# Patient Record
Sex: Female | Born: 1975 | Race: White | Hispanic: No | Smoking: Never smoker
Health system: Southern US, Community
[De-identification: ages and names within clinical notes are randomized; demographics above are authoritative.]

## PROBLEM LIST (undated history)

## (undated) DIAGNOSIS — E079 Disorder of thyroid, unspecified: Secondary | ICD-10-CM

## (undated) DIAGNOSIS — T7840XA Allergy, unspecified, initial encounter: Secondary | ICD-10-CM

## (undated) DIAGNOSIS — C801 Malignant (primary) neoplasm, unspecified: Secondary | ICD-10-CM

## (undated) DIAGNOSIS — F419 Anxiety disorder, unspecified: Secondary | ICD-10-CM

## (undated) DIAGNOSIS — D239 Other benign neoplasm of skin, unspecified: Secondary | ICD-10-CM

## (undated) DIAGNOSIS — J189 Pneumonia, unspecified organism: Secondary | ICD-10-CM

## (undated) HISTORY — PX: OTHER SURGICAL HISTORY: SHX169

## (undated) HISTORY — DX: Allergy, unspecified, initial encounter: T78.40XA

## (undated) HISTORY — PX: KNEE SURGERY: SHX244

## (undated) HISTORY — DX: Other benign neoplasm of skin, unspecified: D23.9

---

## 2005-08-20 ENCOUNTER — Ambulatory Visit: Payer: Self-pay | Admitting: Internal Medicine

## 2005-09-17 ENCOUNTER — Other Ambulatory Visit: Admission: RE | Admit: 2005-09-17 | Discharge: 2005-09-17 | Payer: Self-pay | Admitting: Neurology

## 2005-10-09 ENCOUNTER — Ambulatory Visit: Payer: Self-pay | Admitting: Internal Medicine

## 2006-01-24 ENCOUNTER — Ambulatory Visit: Payer: Self-pay | Admitting: Internal Medicine

## 2006-10-09 ENCOUNTER — Ambulatory Visit: Payer: Self-pay | Admitting: Internal Medicine

## 2006-10-09 ENCOUNTER — Ambulatory Visit (HOSPITAL_COMMUNITY): Admission: RE | Admit: 2006-10-09 | Discharge: 2006-10-09 | Payer: Self-pay | Admitting: Internal Medicine

## 2006-10-22 ENCOUNTER — Ambulatory Visit: Payer: Self-pay | Admitting: Internal Medicine

## 2007-10-14 ENCOUNTER — Inpatient Hospital Stay (HOSPITAL_COMMUNITY): Admission: AD | Admit: 2007-10-14 | Discharge: 2007-10-14 | Payer: Self-pay | Admitting: Obstetrics and Gynecology

## 2007-11-04 ENCOUNTER — Encounter: Payer: Self-pay | Admitting: Internal Medicine

## 2007-11-04 DIAGNOSIS — J309 Allergic rhinitis, unspecified: Secondary | ICD-10-CM | POA: Insufficient documentation

## 2007-11-04 DIAGNOSIS — J45909 Unspecified asthma, uncomplicated: Secondary | ICD-10-CM | POA: Insufficient documentation

## 2008-01-10 ENCOUNTER — Inpatient Hospital Stay (HOSPITAL_COMMUNITY): Admission: AD | Admit: 2008-01-10 | Discharge: 2008-01-12 | Payer: Self-pay | Admitting: Obstetrics and Gynecology

## 2008-01-15 ENCOUNTER — Inpatient Hospital Stay (HOSPITAL_COMMUNITY): Admission: AD | Admit: 2008-01-15 | Discharge: 2008-01-16 | Payer: Self-pay | Admitting: Obstetrics and Gynecology

## 2008-04-15 ENCOUNTER — Other Ambulatory Visit: Admission: RE | Admit: 2008-04-15 | Discharge: 2008-04-15 | Payer: Self-pay | Admitting: Obstetrics and Gynecology

## 2008-08-12 ENCOUNTER — Telehealth: Payer: Self-pay | Admitting: Internal Medicine

## 2009-02-21 ENCOUNTER — Telehealth: Payer: Self-pay | Admitting: Internal Medicine

## 2009-10-13 ENCOUNTER — Ambulatory Visit (HOSPITAL_COMMUNITY): Admission: RE | Admit: 2009-10-13 | Discharge: 2009-10-13 | Payer: Self-pay | Admitting: Obstetrics and Gynecology

## 2009-11-29 ENCOUNTER — Telehealth: Payer: Self-pay | Admitting: Internal Medicine

## 2009-12-05 ENCOUNTER — Telehealth: Payer: Self-pay | Admitting: Internal Medicine

## 2009-12-30 ENCOUNTER — Inpatient Hospital Stay (HOSPITAL_COMMUNITY): Admission: AD | Admit: 2009-12-30 | Discharge: 2010-01-01 | Payer: Self-pay | Admitting: Obstetrics and Gynecology

## 2010-05-30 ENCOUNTER — Ambulatory Visit: Payer: Self-pay | Admitting: Obstetrics and Gynecology

## 2010-05-30 ENCOUNTER — Other Ambulatory Visit: Admission: RE | Admit: 2010-05-30 | Discharge: 2010-05-30 | Payer: Self-pay | Admitting: Obstetrics and Gynecology

## 2010-05-31 ENCOUNTER — Telehealth: Payer: Self-pay | Admitting: Internal Medicine

## 2010-08-17 ENCOUNTER — Ambulatory Visit: Payer: Self-pay | Admitting: Internal Medicine

## 2010-11-07 NOTE — Progress Notes (Signed)
  Phone Note Refill Request   Refills Requested: Medication #1:  PULMICORT FLEXHALER 180 MCG/ACT AEPB inhale 2 sprays two times a day Initial call taken by: Ami Bullins CMA,  May 31, 2010 2:04 PM    Prescriptions: PULMICORT FLEXHALER 180 MCG/ACT AEPB (BUDESONIDE) inhale 2 sprays two times a day  #1 x 12   Entered by:   Ami Bullins CMA   Authorized by:   Jacques Navy MD   Signed by:   Bill Salinas CMA on 05/31/2010   Method used:   Electronically to        [facility] (retail)       [address]       [place], [place]  [address]       Ph: [phone]       Fax: [phone]   RxID:   [id number]

## 2010-11-07 NOTE — Assessment & Plan Note (Signed)
Summary: flu shot-lb  Nurse Visit   Allergies: 1)  ! * Shellfish  Orders Added: 1)  Admin 1st Vaccine [[address]] 2)  Flu Vaccine 5yrs + [[address]]      Flu Vaccine Consent Questions     Do you have a history of severe allergic reactions to this vaccine? no    Any prior history of allergic reactions to egg and/or gelatin? no    Do you have a sensitivity to the preservative Thimersol? no    Do you have a past history of Guillan-Barre Syndrome? no    Do you currently have an acute febrile illness? no    Have you ever had a severe reaction to latex? no    Vaccine information given and explained to patient? yes    Are you currently pregnant? no    Lot Number:[id number]   Exp Date:04/07/2011   Site Given  Left Deltoid IM1

## 2010-11-07 NOTE — Progress Notes (Signed)
  Phone Note Refill Request Message from:  Fax from Pharmacy on December 05, 2009 9:51 AM  Refills Requested: Medication #1:  VENTOLIN HFA 108 (90 BASE) MCG/ACT AERS take 2 puffs q 4 hours prn. Initial call taken by: Ami Bullins CMA,  December 05, 2009 9:51 AM    Prescriptions: VENTOLIN HFA 108 (90 BASE) MCG/ACT AERS (ALBUTEROL SULFATE) take 2 puffs q 4 hours prn  #1 x 3   Entered by:   Ami Bullins CMA   Authorized by:   Jacques Navy MD   Signed by:   Bill Salinas CMA on 12/05/2009   Method used:   Faxed to ...       [facility] (retail)       [address]       [place], [place]  [address]       Ph: [phone]       Fax: [phone]   RxID:   [id number]

## 2010-11-07 NOTE — Progress Notes (Signed)
  Phone Note Refill Request Message from:  Fax from Pharmacy on November 29, 2009 12:47 PM  Refills Requested: Medication #1:  VENTOLIN HFA 108 (90 BASE) MCG/ACT AERS take 2 puffs q 4 hours prn.   Last Refilled: 08/22/2009 Last Office Visit was 2008, Please Advise refill  Initial call taken by: Ami Bullins CMA,  November 29, 2009 12:48 PM  Follow-up for Phone Call        ok for refill. When is the baby due? Follow-up by: Jacques Navy MD,  November 29, 2009 5:49 PM    Prescriptions: VENTOLIN HFA 108 (90 BASE) MCG/ACT AERS (ALBUTEROL SULFATE) take 2 puffs q 4 hours prn  #1 x 1   Entered by:   Ami Bullins CMA   Authorized by:   Jacques Navy MD   Signed by:   Bill Salinas CMA on 11/30/2009   Method used:   Electronically to        [facility]. [phone]* (retail)       [address]       [place], [place]  [address]       Ph: [phone]       Fax: [phone]   RxID:   [id number]

## 2010-12-24 LAB — RH IMMUNE GLOBULIN WORKUP (NOT WOMEN'S HOSP)
ABO/RH(D): O NEG
Antibody Screen: NEGATIVE

## 2010-12-31 LAB — CBC
HCT: 38.1 % (ref 36.0–46.0)
Hemoglobin: 13.2 g/dL (ref 12.0–15.0)
MCV: 93 fL (ref 78.0–100.0)
MCV: 94.2 fL (ref 78.0–100.0)
Platelets: 182 10*3/uL (ref 150–400)
Platelets: 199 10*3/uL (ref 150–400)
RBC: 3.69 MIL/uL — ABNORMAL LOW (ref 3.87–5.11)
RBC: 4.1 MIL/uL (ref 3.87–5.11)
RDW: 13.9 % (ref 11.5–15.5)
WBC: 17.9 10*3/uL — ABNORMAL HIGH (ref 4.0–10.5)

## 2010-12-31 LAB — RH IMMUNE GLOB WKUP(>/=20WKS)(NOT WOMEN'S HOSP)

## 2011-01-09 ENCOUNTER — Other Ambulatory Visit: Payer: Self-pay | Admitting: Internal Medicine

## 2011-02-20 NOTE — H&P (Signed)
NAME:  Amy Bautista, Amy Bautista          ACCOUNT NO.:  [id number]   MEDICAL RECORD NO.:  [id number]          PATIENT TYPE:  MAT   LOCATION:  [facility]                          FACILITY:  [facility]   PHYSICIAN:  Osborn Coho, M.D.   DATE OF BIRTH:  January 06, 1976   DATE OF ADMISSION:  01/10/2008  DATE OF DISCHARGE:                              HISTORY & PHYSICAL   The patient is a 35 year old, married white female, primigravida at 75  and 5/7 weeks per an [facility] of January 05, 2008 who presents to maternity  admissions in early labor with regular contractions since last night.  She was having irregular contractions every 5-10 minutes over the last  24 hours. She reports little rest.  She denies any leakage of fluid, PIH  or UTI signs or symptoms, shortness of breath, cough or fever.  She  reports good fetal movement.  She has had some nausea but no vomiting.  She was seen in the office on Thursday and cervix was 1 cm, membranes  were stripped by Wynelle Bourgeois, CNM. Previously this week on Monday,  March 30, the patient's grandmother had passed away and the patient's  blood pressure was slightly elevated at 122/90 and PIH labs were drawn.  They were within normal limits and since that visit she had been  normotensive.  She did have a reactive NST on Thursday in the office  when she followed up. She has been followed by the CNM service at  [facility] and history has been remarkable for: 1)  Rh  negative, 2) history of asthma.   OBSTETRICAL HISTORY:  The patient is a primigravida.   PRENATAL LABS:  The patient's blood type is O negative, HIV nonreactive,  GC and chlamydia were negative, Rh antibody screen was negative, RPR  nonreactive.  She is rubella immune. Hepatitis surface antigen was  negative and hemoglobin at her new OB was 11.9.  Group beta strep is  negative.   PAST MEDICAL HISTORY:  She reports menarche at age 77.  She reports 28-  31 days cycles with length of flow 4-5 days.  She  usually has heavy  menses with some cramping. She reports condoms for birth control in the  past. She has had an abnormal Pap but repeat was normal after a  colposcopy in March of 2008 that showed reactive changes.  She has had  occasional yeast, she reports normal childhood illnesses including  varicella.  Reports asthma since child. She has taken Pulmicort and  Zyrtec in the past and Dr. Debby Bud is who she follows. She had a bladder  infection in 2002. She had a dislocated right knee at age 46.  She has  had dental surgery at age 78.   GENETIC HISTORY:  Remarkable for patient's cousin being mentally  retarded and father of the baby's grandmother had twins.   FAMILY HISTORY:  Paternal grandfather an MI, brother with chronic  hypertension, father had melanoma, he is deceased.  Maternal grandmother  had breast cancer.  Mother depression. Mother nicotine addiction and  both parents alcoholics as well as other family members.   ALLERGIES:  Shellfish,  unknown iodine status. Cats, trees and then other  environmental. She denies medication or latex allergies.   SOCIAL HISTORY:  She is a married white female.  Husband's name is Denice Paradise. He is supportive and at bedside.  The patient reports 16 years  of education and is a Psychologist, clinical full time. Her  husband has had 19 years of education and is a full-time physical  therapist. They do not report a religious affiliation. The patient  entered care in September 2008 at [facility]. She reported at that time that  her husband that was finishing school in [place] for his physical  therapy. Unsure regarding doing quad screen. She was somewhere around 14  weeks, the first page of her prenatal record is missing at the time of  this dictation.  She was then seen at 18-1/7 weeks, had anatomy  ultrasound showing single intrauterine pregnancy size consistent with  dates.  Cervical length 4.35 cm, posterior placenta, normal fluid with   normal growth and development.  No anomalies were observed at that time.  She had had some paresthesia in her right upper thigh but was improving  at that time. Seen then at 22 weeks she had an audible decel. The  patient had an ultrasound for AFI that 17.9, placenta was anterior and  grade 1, cervix was 4.4 cm. Fetal kick counts were reviewed. The patient  was then seen at 23 weeks, was doing well,  returned at that time and  had seen Dr. Estanislado Pandy after the decel while she was in the office the  previous time. She then had her Glucola at 26-4/7 weeks.  Hemoglobin was  11.2. The Glucola was within normal limits equal to 106, her RPR was  nonreactive.  She was doing well. Pregnancy continued to progress  without other complications. Her mom had recently been here from  [place] and as was mentioned in the HPI she had a slightly high blood  pressure for several weeks after hearing of the death of her grandmother  but is since normotensive   OBJECTIVE:  VITAL SIGNS:  On admission the patient's blood pressure was  130s/80s, temperature was 99. EFM heart rate was in one teens on first  arrival and has since resolved to a baseline in the 120s, moderate  variability, reactive, no decels, toco UCs moderate on palpation every 2-  4 minutes.  GENERAL:  Noted discomfort with contractions.  She is alert and oriented  x3.  HEENT:  Grossly intact and within normal limits.  CARDIOVASCULAR:  Regular rate and rhythm without murmur.  LUNGS:  Clear to auscultation bilaterally.  ABDOMEN:  Soft, nontender and gravid.  Estimated fetal weight 7 to 7-1/2  pounds.  PELVIC:  Cervix was 3-1/2 cm, 80%,  -1 vertex, intact.  She had a small  amount of vaginal bleeding noted on glove but no fluid.  EXTREMITIES:  Within normal limits, just some trace edema.  DTRs were  2+, no clonus.   IMPRESSION:  1. Intrauterine pregnancy at 40 and 5.  2. Early labor.  3. Group beta strep negative.  4. Noted discomfort with  contractions but the patient is undecided      regarding pain interventions   PLAN:  1. Admit to birthing suites with Dr. Su Hilt as attending physician.  2. Routine CNM and L&D orders.  3. Support and/or epidural p.r.n.  4. Plan AROM p.r.n. augmentation.  5. Consult with MD p.r.n.      Candice Oakland,  CNM      Osborn Coho, M.D.  Electronically Signed    CHS/MEDQ  D:  01/10/2008  T:  01/10/2008  Job:  [id number]

## 2011-02-23 NOTE — Assessment & Plan Note (Signed)
[facility]                           PRIMARY CARE OFFICE NOTE   NAME:[name], [name]                  MRN:          [id number]  DATE:10/10/2006                            DOB:          Nov 26, 1975    HISTORY OF PRESENT ILLNESS:  [name] was seen on October 09, 2006.  Please see that dictation.  This is a home visit note.   The patient has been taking Omnicef for three days and started  azithromycin 500 mg daily today.  She reports that she is feeling  somewhat better.  She has been able to take a small amount of  nourishment.  She continues to force fluids.  She has had some mild  nausea but no emesis.  She reports her pleuritic chest pain has improved  and that she is not short of breath.  She does have a continuous cough  that is productive of scan amount of sputum.   PHYSICAL EXAMINATION:  The patient was examined in the living room  sitting on the couch.  VITAL SIGNS:  Blood pressure 103/75, heart rate 85, respirations 16 and  unlabored, temperature 97.1.  GENERAL:  Well-nourished woman in no acute distress.  She has no  increased work of breathing.  CHEST:  The patient has no wheezing.  Air is moving well.  She has  positive egophony at the right back chest.   IMPRESSION/PLAN:  Pneumonia.  The patient seems stable.  Blood pressures  improved.  She is afebrile.  Heart rate is down from 112.   PLAN:  The patient is to continue azithromycin for a full three day  course.  She is to continue [facility] for a full 10-day course.  She is to  continue with Phenergan with codeine p.r.n.  The patient is advised that  she has fever, inability to keep down clear fluids or medication,  increasing shortness of breath or other signs of deterioration.  She is  to notify me for immediate evaluation.  If the patient does well, I  would like to see her back in the office in 14 days for follow up and  follow up chest x-ray.     Rosalyn Gess  Norins, MD  Electronically Signed    MEN/MedQ  DD: 10/11/2006  DT: 10/11/2006  Job #: [id number]

## 2011-02-23 NOTE — Assessment & Plan Note (Signed)
[facility]                           PRIMARY CARE OFFICE NOTE   NAME:WESTELLYSA, PARRACK                  MRN:          [id number]  DATE:10/09/2006                            DOB:          08/02/1976    Ms. Corinda Gubler is a 35 year old woman who I saw yesterday,  October 09, 2006.  She reports that 4 weeks ago, she developed a cough that was  productive of a purulent sputum.  Her uncle diagnosed her with  bronchitis and started her on Omnicef 300 mg b.i.d. for 7 days.  She had  significant improvement in her symptoms.  The patient subsequently made  a trip to [place] and while there developed a cold.  She had no  treatment.  She returned on December 31 to the [place].  She reports that she  took a  long walk and returned very chilled.  She had no appetite, she  has had a weak, nonproductive cough.  She has had sweats and what sputum  she has been able to produce has been snotty in appearance.  She  generally feels weak.  She has not eaten much, but she has been able to  keep down fluids.   PAST MEDICAL HISTORY:  Significant for asthma, which she controls with  Advair 100/50 one inhalation b.i.d.  Other past medical history is well  documented.   REVIEW OF SYSTEMS:  Significant for fever, chills and myalgias.  No  ophthal, ENT, cardiovascular complaints.  Respiratory as the above.  No  GI complaints.  Specifically denies vomiting or diarrhea.  The patient  does complain of chest wall pain.   EXAMINATION:  Temperature was 97.4, blood pressure 96/59, pulse 112,  weight is 168.  GENERAL APPEARANCE:  This is a well-nourished woman who is in no acute  distress but does appear to be ill.  HEENT:  Normocephalic, atraumatic.  Oral pharynx without lesions.  Posterior pharynx was clear.  CHEST:  The patient had good breath sounds.  There were slightly  decreased breath sounds at the right base.  There was a question of very  faint end-expiratory wheeze at the  right base.  The patient had positive  egophony and positive fremitus at the base.  The patient had discomfort  with deep inspiration.  The patient's chest wall was tender to palpation  at the anterior chest wall and at the axillary line.   ASSESSMENT:  Pneumonia.  The patient with probable pneumonia based on  physical exam.  She was started on Omnicef 300 mg b.i.d. for 10 days,  Phenergan with codeine cough syrup 1 teaspoon q.6 two teaspoons at  bedtime, Mucinex 2 tablets a.m. and p.m.  She is instructed to hydrate  vigorously.  The patient was sent for chest  x-ray.   Chest x-ray report was acute right lower lobe pneumonia.  A CBC could  not be drawn because patient was too dehydrated for blood draw.  I spoke  with the patient in the evening.  She had been able to take down copious  fluids.  She had no nausea or vomiting.   FOLLOWUP October 10, 2006:  This morning the patient reports that she  slept well.  She has been continuing to hydrate.  She has had no nausea  or vomiting.  She reports she still has a cough.  She has decreased  pleuritic type pain.  She reports no wheezing but she does have a deep  kind of rattle with deep inspiration.   PLAN:  The patient to continue instructions as outlined above.  We will  add azithromycin 500 mg daily x3 to the patient's regimen.  We will  follow up with her this afternoon and we will make home visit in order  to monitor oxygen saturation, blood pressure and temperature as well as  repeat auscultation of the chest.     Rosalyn Gess. Norins, MD  Electronically Signed    MEN/MedQ  DD: 10/10/2006  DT: 10/10/2006  Job #: [id number]

## 2011-05-23 ENCOUNTER — Encounter: Payer: Self-pay | Admitting: Gynecology

## 2011-06-05 ENCOUNTER — Encounter: Payer: Self-pay | Admitting: Obstetrics and Gynecology

## 2011-06-28 LAB — RH IMMUNE GLOBULIN WORKUP (NOT WOMEN'S HOSP)
ABO/RH(D): O NEG
Antibody Screen: NEGATIVE

## 2011-07-03 LAB — RH IMMUNE GLOB WKUP(>/=20WKS)(NOT WOMEN'S HOSP): Fetal Screen: NEGATIVE

## 2011-07-03 LAB — COMPREHENSIVE METABOLIC PANEL
AST: 52 — ABNORMAL HIGH
Albumin: 3.1 — ABNORMAL LOW
CO2: 25
Calcium: 8.9
Chloride: 101
Creatinine, Ser: 0.89
Glucose, Bld: 93
Potassium: 3.9
Sodium: 136
Total Protein: 6.6

## 2011-07-03 LAB — CBC
HCT: 30.3 — ABNORMAL LOW
HCT: 37.7
Hemoglobin: 10.5 — ABNORMAL LOW
Hemoglobin: 13.1
MCV: 93.4
Platelets: 279
RBC: 3.21 — ABNORMAL LOW
RDW: 13.3
WBC: 13.5 — ABNORMAL HIGH

## 2011-07-03 LAB — URINE MICROSCOPIC-ADD ON

## 2011-07-03 LAB — URIC ACID: Uric Acid, Serum: 5.9

## 2011-07-03 LAB — URINALYSIS, ROUTINE W REFLEX MICROSCOPIC
Glucose, UA: NEGATIVE
Specific Gravity, Urine: <1.005 — ABNORMAL LOW
pH: 6.5

## 2011-07-13 ENCOUNTER — Other Ambulatory Visit (HOSPITAL_COMMUNITY)
Admission: RE | Admit: 2011-07-13 | Discharge: 2011-07-13 | Disposition: A | Discharge status: Home / Self Care | Payer: BC Managed Care – PPO | Source: Ambulatory Visit | Attending: Obstetrics and Gynecology | Admitting: Obstetrics and Gynecology

## 2011-07-13 ENCOUNTER — Encounter: Payer: Self-pay | Admitting: Obstetrics and Gynecology

## 2011-07-13 ENCOUNTER — Ambulatory Visit (INDEPENDENT_AMBULATORY_CARE_PROVIDER_SITE_OTHER): Payer: BC Managed Care – PPO | Admitting: Obstetrics and Gynecology

## 2011-07-13 VITALS — BP 110/78 | Ht 69.25 in | Wt 170.0 lb

## 2011-07-13 DIAGNOSIS — Z01419 Encounter for gynecological examination (general) (routine) without abnormal findings: Secondary | ICD-10-CM

## 2011-07-13 DIAGNOSIS — Z23 Encounter for immunization: Secondary | ICD-10-CM

## 2011-07-13 NOTE — Progress Notes (Signed)
The patient came back to see me today for an annual GYN exam. She is still nursing once a day at 19 months. In spite of that she's having regular periods. Her periods are much better than they were before children. She contraception with condoms. She is contemplating a Mirena IUD. She has done poorly with hormones in the past and that makes her a little nervous.  Physical examination: HEENT within normal limits. Neck: Thyroid not large. No masses. Supraclavicular nodes: not enlarged. Breasts: Examined in both sitting midline position. No skin changes and no masses. Abdomen: Soft no guarding rebound or masses or hernia. Pelvic: External: Within normal limits. BUS: Within normal limits. Vaginal:within normal limits. Good estrogen effect. No evidence of cystocele rectocele or enterocele. Cervix: clean. Uterus: Normal size and shape. Adnexa: No masses. Rectovaginal exam: Confirmatory and negative. Extremities: Within normal limits.   Assessment: Normal GYN exam  Plan: We had a long discussion about types of contraception. I tried to reassure her about a Mirena IUD without giving her a guarantee. She will decide and let me know. They may choose for her husband have a vasectomy.

## 2011-07-17 ENCOUNTER — Telehealth: Payer: Self-pay | Admitting: *Deleted

## 2011-07-17 NOTE — Telephone Encounter (Signed)
Patient called and asked about getting benefits processed for Mirena IUD. Told patient IUD covered with a $30 copay.  Patient will think about it and let us know if that is indeed something she wants to go through with.

## 2011-07-18 ENCOUNTER — Other Ambulatory Visit: Payer: Self-pay | Admitting: *Deleted

## 2011-07-18 MED ORDER — BUDESONIDE 180 MCG/ACT IN AEPB: 1.0000 | INHALATION_SPRAY | Freq: Two times a day (BID) | RESPIRATORY_TRACT | Status: DC

## 2011-07-23 ENCOUNTER — Ambulatory Visit (INDEPENDENT_AMBULATORY_CARE_PROVIDER_SITE_OTHER): Payer: BC Managed Care – PPO | Admitting: Internal Medicine

## 2011-07-23 VITALS — BP 114/64 | HR 78 | Temp 97.0°F | Wt 167.0 lb

## 2011-07-23 DIAGNOSIS — J069 Acute upper respiratory infection, unspecified: Secondary | ICD-10-CM

## 2011-07-23 MED ORDER — FLUTICASONE PROPIONATE 50 MCG/ACT NA SUSP: 1.0000 | Freq: Every day | NASAL | Status: DC

## 2011-07-23 MED ORDER — DOXYCYCLINE HYCLATE 100 MG PO TABS: 100.0000 mg | ORAL_TABLET | Freq: Two times a day (BID) | ORAL | Status: AC

## 2011-07-24 ENCOUNTER — Encounter: Payer: Self-pay | Admitting: Internal Medicine

## 2011-07-24 NOTE — Progress Notes (Signed)
  Subjective:    Patient ID: Amy Bautista, female    DOB: 1976-03-14, 35 y.o.   MRN: [id number]  HPI Amy Bautista presents with a 1 month h/o cough productive thick, colored sputum. She has not had fever or chills. Her children have been sick with mycoplasma. Her asthma has not flared. She has not had DOE. No N/V.  Past Medical History  Diagnosis Date  . Asthma   . Dysplastic nevi     excised lesions: Left UE, Right LE   Past Surgical History  Procedure Date  . Orthodontic   . G2p2    Family History  Problem Relation Age of Onset  . Cancer Father     melanoma  . Hypertension Brother   . Hypertension Paternal Grandfather   . Heart disease Paternal Grandfather   . Cancer Paternal Grandmother     LUNG   . Cancer Maternal Grandmother     breast   History   Social History  . Marital Status: Married    Spouse Name: N/A    Number of Children: 2  . Years of Education: 12+   Occupational History  . yog instructor    Social History Main Topics  . Smoking status: Never Smoker   . Smokeless tobacco: Never Used  . Alcohol Use: 2.0 oz/week    4 drink(s) per week  . Drug Use: No     former [facility]C  . Sexually Active: Yes -- Female partner(s)    Birth Control/ Protection: Condom   Other Topics Concern  . 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 No h/o abuse.       Review of Systems System review is negative for any constitutional, cardiac, pulmonary, GI or neuro symptoms or complaints other than as described in the HPI.     Objective:   Physical Exam Vitals reviewed - normal Gen'l - WNWD white woman in no distress HEENT - [place]/AT, no sinus tenderness Chest - no rales or wheee Cor - RRR       Assessment & Plan:  URI- exposed to mycoplasma, prolonged cough that is productive.   Plan - Doxycycline 100 mg bid x 7           Anti-tussive of choice: robitussin DM  or equivalent           Flonase prn

## 2011-08-07 ENCOUNTER — Telehealth: Payer: Self-pay | Admitting: *Deleted

## 2011-08-07 NOTE — Telephone Encounter (Signed)
Pt called with referral information to [facility] at [phone]. I left her a message.  Need to know if she needs a referral or what.

## 2011-08-14 NOTE — Telephone Encounter (Signed)
Pt never called back therefore closed encounter

## 2012-03-02 ENCOUNTER — Other Ambulatory Visit: Payer: Self-pay | Admitting: Internal Medicine

## 2012-06-14 ENCOUNTER — Other Ambulatory Visit: Payer: Self-pay | Admitting: Internal Medicine

## 2012-06-14 MED ORDER — ALPRAZOLAM 0.25 MG PO TABS
0.2500 mg | ORAL_TABLET | Freq: Every day | ORAL | Status: AC | PRN
Start: 2012-06-14 — End: 2012-07-14

## 2012-07-07 ENCOUNTER — Encounter: Payer: Self-pay | Admitting: Internal Medicine

## 2012-07-07 ENCOUNTER — Ambulatory Visit (INDEPENDENT_AMBULATORY_CARE_PROVIDER_SITE_OTHER): Payer: BC Managed Care – PPO | Admitting: Internal Medicine

## 2012-07-07 VITALS — BP 106/68 | HR 72 | Temp 98.1°F | Resp 16 | Wt 167.0 lb

## 2012-07-07 DIAGNOSIS — N943 Premenstrual tension syndrome: Secondary | ICD-10-CM | POA: Insufficient documentation

## 2012-07-07 MED ORDER — AMOXICILLIN 875 MG PO TABS: 875.0000 mg | ORAL_TABLET | Freq: Two times a day (BID) | ORAL | Status: AC

## 2012-07-07 NOTE — Assessment & Plan Note (Signed)
Has variable symptoms - often with irritability.   Plan - alprazolam 0.25 mg prn. Discussed safety and side affects with her.

## 2012-07-07 NOTE — Patient Instructions (Addendum)
Otitis media left ear - plan: amoxicillin 875 mg twice a day for 7 days; sudafed 30 mg two or three times day for eustachian tube dysfunction; tylenol 500 mg for fever.  For perimenstrual mood disturbance it is very reasonable to use the Xanax as needed.  Otitis Media, Adult A middle ear infection is an infection in the space behind the eardrum. The medical name for this is "otitis media." It may happen after a common cold. It is caused by a germ that starts growing in that space. You may feel swollen glands in your neck on the side of the ear infection. HOME CARE INSTRUCTIONS    Take your medicine as directed until it is gone, even if you feel better after the first few days.   Only take over-the-counter or prescription medicines for pain, discomfort, or fever as directed by your caregiver.   Occasional use of a nasal decongestant a couple times per day may help with discomfort and help the eustachian tube to drain better.  Follow up with your caregiver in 10 to 14 days or as directed, to be certain that the infection has cleared. Not keeping the appointment could result in a chronic or permanent injury, pain, hearing loss and disability. If there is any problem keeping the appointment, you must call back to this facility for assistance. SEEK IMMEDIATE MEDICAL CARE IF:    You are not getting better in 2 to 3 days.   You have pain that is not controlled with medication.   You feel worse instead of better.   You cannot use the medication as directed.   You develop swelling, redness or pain around the ear or stiffness in your neck.  MAKE SURE YOU:    Understand these instructions.   Will watch your condition.   Will get help right away if you are not doing well or get worse.  Document Released: 06/29/2004 Document Revised: 09/13/2011 Document Reviewed: 04/30/2008 [facility] Patient Information 2012 [place], [place].

## 2012-07-07 NOTE — Progress Notes (Signed)
Subjective:    Patient ID: Amy Bautista, female    DOB: [birth year], 36 y.o.   MRN: [id number]  HPI Amy Bautista reports that she has had a mild URI. She was awakened in the middle of the night by severe left ear pain and dampened hearing. She took otc ibuprofen and APAP. Her pain is much better. She has continued to have muffled hearing. No fever, no sore throat, no tender lymph nodes.  Past Medical History  Diagnosis Date  . Asthma   . Dysplastic nevi     excised lesions: Left UE, Right LE   Past Surgical History  Procedure Date  . Orthodontic   . G2p2    Family History  Problem Relation Age of Onset  . Cancer Father     melanoma  . Hypertension Brother   . Hypertension Paternal Grandfather   . Heart disease Paternal Grandfather   . Cancer Paternal Grandmother     LUNG   . Cancer Maternal Grandmother     breast   History   Social History  . Marital Status: Married    Spouse Name: N/A    Number of Children: 2  . Years of Education: 12+   Occupational History  . yog instructor    Social History Main Topics  . Smoking status: Never Smoker   . Smokeless tobacco: Never Used  . Alcohol Use: 2.0 oz/week    4 drink(s) per week  . Drug Use: No     former THC  . Sexually Active: Yes -- Female partner(s)    Birth Control/ Protection: Condom   Other Topics Concern  . 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 No h/o abuse.    Current Outpatient Prescriptions on File Prior to Visit  Medication Sig Dispense Refill  . ALPRAZolam (XANAX) 0.25 MG tablet Take 1 tablet (0.25 mg total) by mouth daily as needed for sleep.  30 tablet  3  . budesonide (PULMICORT FLEXHALER) 180 MCG/ACT inhaler Inhale 1 puff into the lungs 2 (two) times daily.  1 Inhaler  6  . Cetirizine HCl (ZYRTEC PO) Take by mouth.        . fluticasone (FLONASE) 50 MCG/ACT nasal spray Place 1 spray into  the nose daily.  16 g  5  . Prenatal Vit-Fe Psac Cmplx-FA (PRENATAL MULTIVITAMIN) 60-1 MG tablet Take 1 tablet by mouth daily.        . VENTOLIN HFA 108 (90 BASE) MCG/ACT inhaler USE 2 PUFFS EVERY 4 HOURS AS NEEDED  18 g  3  . DISCONTD: Budesonide (PULMICORT IN) Inhale into the lungs.            Review of Systems System review is negative for any constitutional, cardiac, pulmonary, GI or neuro symptoms or complaints other than as described in the HPI.     Objective:   Physical Exam Filed Vitals:   07/07/12 1101  BP: 106/68  Pulse: 72  Temp: 98.1 F (36.7 C)  Resp: 16   gen'l- WNWD woman in no distress HEENT- left TM red and angry; poor TM movement with insufflation bilaterally. throa with cobble stone appearance. Cor- RRR Pulm - normal respirations       Assessment & Plan:  Otitis media left ear - plan: amoxicillin 875 mg twice a day for 7 days; sudafed 30 mg two or three times day for eustachian tube dysfunction; tylenol 500  mg for fever.

## 2012-09-13 ENCOUNTER — Other Ambulatory Visit: Payer: Self-pay | Admitting: Internal Medicine

## 2012-11-10 ENCOUNTER — Other Ambulatory Visit: Payer: Self-pay | Admitting: Dermatology

## 2012-11-12 ENCOUNTER — Other Ambulatory Visit: Payer: Self-pay | Admitting: Internal Medicine

## 2012-12-24 ENCOUNTER — Ambulatory Visit (INDEPENDENT_AMBULATORY_CARE_PROVIDER_SITE_OTHER): Payer: BC Managed Care – PPO | Admitting: Internal Medicine

## 2012-12-24 ENCOUNTER — Encounter: Payer: Self-pay | Admitting: Internal Medicine

## 2012-12-24 VITALS — BP 116/84 | HR 65 | Temp 97.5°F | Resp 12 | Ht 70.0 in | Wt 158.0 lb

## 2012-12-24 DIAGNOSIS — J309 Allergic rhinitis, unspecified: Secondary | ICD-10-CM

## 2012-12-24 DIAGNOSIS — Z0000 Encounter for general adult medical examination without abnormal findings: Secondary | ICD-10-CM

## 2012-12-24 DIAGNOSIS — Z23 Encounter for immunization: Secondary | ICD-10-CM

## 2012-12-24 DIAGNOSIS — J45909 Unspecified asthma, uncomplicated: Secondary | ICD-10-CM

## 2012-12-24 MED ORDER — TETANUS-DIPHTH-ACELL PERTUSSIS 5-2.5-18.5 LF-MCG/0.5 IM SUSP: 0.5000 mL | Freq: Once | INTRAMUSCULAR | Status: DC

## 2012-12-24 NOTE — Patient Instructions (Addendum)
Thank you for coming to see me.  Lymph node - not so much. No abnormality on my exam. It is good to be watchful and you should let me know if you notice any change  Tonsillar crypts - on exam no abnormally large. Plan - use of a water pic for regular cleansing - hose it down.  Circulation - palpable pulses but slow capillary refill suggestive of smaller than normal end circulation sized vessels. Not pathologic  Allergic rhinitus - try the dymista to replace flonase and zyrtec. The Veramyst is super active flonase.  Return in 3 years for routine follow up, sooner as needed.  Please sign up for MyChart.

## 2012-12-24 NOTE — Progress Notes (Signed)
Subjective:    Patient ID: Amy Bautista, female    DOB: [birth year], 37 y.o.   MRN: [id number]  HPI Amy Bautista presents for general wellness exam. 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  She has had an enlarged lymph node left posterior cervical chain which has been stable.  She reports that she has a large tonsillar crypt which collects detris and contributes to halitosis.  Her long standing allergic rhinitis is acting up. We discussed relatively new treatment options    She has a concern about chronically cool distal extremities with increased perspiration.  General health maintenance is up to date except for Tdap. Recent labs for insurance were all normal.   Past Medical History  Diagnosis Date  . Asthma   . Dysplastic nevi     excised lesions: Left UE, Right LE   Past Surgical History  Procedure Laterality Date  . Orthodontic    . G2p2     Family History  Problem Relation Age of Onset  . Cancer Father     melanoma  . Hypertension Brother   . Hypertension Paternal Grandfather   . Heart disease Paternal Grandfather   . Cancer Paternal Grandmother     LUNG   . Cancer Maternal Grandmother     breast   History   Social History  . Marital Status: Married    Spouse Name: N/A    Number of Children: 2  . Years of Education: 12+   Occupational History  . yog instructor    Social History Main Topics  . Smoking status: Never Smoker   . Smokeless tobacco: Never Used  . Alcohol Use: No  . Drug Use: No     Comment: former THC  . Sexually Active: Yes -- Female partner(s)    Birth Control/ Protection: Condom   Other Topics Concern  . 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 No h/o abuse.    Current Outpatient Prescriptions on File Prior to Visit  Medication Sig Dispense Refill  .  Cetirizine HCl (ZYRTEC PO) Take by mouth.        . fluticasone (FLONASE) 50 MCG/ACT nasal spray instill 1 spray INTO THE NOSE DAILY  16 g  5  . Prenatal Vit-Fe Psac Cmplx-FA (PRENATAL MULTIVITAMIN) 60-1 MG tablet Take 1 tablet by mouth daily.        Marland Kitchen PULMICORT FLEXHALER 180 MCG/ACT inhaler INHALE 1 PUFF INTO THE LUNGS 2 TIMES A DAY  1 each  6  . VENTOLIN HFA 108 (90 BASE) MCG/ACT inhaler USE 2 PUFFS EVERY 4 HOURS AS NEEDED  18 g  3   No current facility-administered medications on file prior to visit.     Review of Systems Constitutional:  Negative for fever, chills, activity change and unexpected weight change.  HEENT:  Negative for hearing loss, ear pain, congestion, neck stiffness and postnasal drip. Negative for sore throat or swallowing problems. Negative for dental complaints.   Eyes: Negative for vision loss or change in visual acuity.  Respiratory: Negative for chest tightness and wheezing. Negative for DOE.   Cardiovascular: Negative for chest pain or palpitations. No decreased exercise tolerance Gastrointestinal: No change in bowel habit. No bloating or gas. No reflux or indigestion Genitourinary: Negative for urgency, frequency, flank pain and difficulty urinating.  Musculoskeletal:  Negative for myalgias, back pain, arthralgias and gait problem.  Neurological: Negative for dizziness, tremors, weakness and headaches.  Hematological: Negative for adenopathy.  Psychiatric/Behavioral: Negative for behavioral problems and dysphoric mood.       Objective:   Physical Exam Filed Vitals:   12/24/12 0925  BP: 116/84  Pulse: 65  Temp: 97.5 F (36.4 C)  Resp: 12   Wt Readings from Last 3 Encounters:  12/24/12 158 lb (71.668 kg)  07/07/12 167 lb (75.751 kg)  07/23/11 167 lb (75.751 kg)   Gen'l - WNWD athletic appearing woman in no distress HEENT - C&S clear, PERRLA, EACs/TMs normal, oropharynx w/o lesions, posterior pharynx - normal size tonsils w/o large crypts or defects, no  exudate or erythema, native dentition in good condition. Neck - supple, w/o thyromegaly Nodes - no enlarged nodes. A small, shotty node deeply seated left posterior cervical chain is found with patient's direction. Submental, submandibular, anterior chair, supraclavicular regions clear. Cor - 2+ radial pulse, 1-2+ DP and PT pulses noted, slow capillary refill distal foot noted, minimally slow refill fingers noted. No JVD, no carotid bruits, quiet precordium, RRR w/o murmur. Pulm - normal respirations, no rales or wheezes Breast - deferred to gyn Abd- w/o HSM, no guarding or rebound. Pelvic/rectal - deferred to gyn Ext- no deformity Neuro - A&O x 3, CN II-XII grossly normal, DTRs 2+ symmetrical, MS - 5/5, cerebellar - normal gait and station. Derm - no lesions noted head,face, neck, upper back, upper chest, arms.      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 Reassurance provided.  2. Tonsillar debris - no abnormally enlarged crypts or defects seen in either tonsil  Plan Routine hygiene using a water-pic  3. Circulation - no evidence of peripheral arterial disease but exam is consistent with small end-arteriole structure w/o Raynaud's.  Plan  Care about cold exposure: gloves in cold weather, cup insulators with iced beverages.

## 2012-12-25 DIAGNOSIS — Z0000 Encounter for general adult medical examination without abnormal findings: Secondary | ICD-10-CM | POA: Insufficient documentation

## 2012-12-25 NOTE — Assessment & Plan Note (Signed)
Life long problem. Discussed alternative treatments - switching from Flonase to [facility] for greater penetration or use of Dymist (azelastin + flonase) as a single agent to replace Zyrtec plus flonase.  Rx for both provided.

## 2012-12-25 NOTE — Assessment & Plan Note (Signed)
Interval history is without major illness,surgery or injury. Limited physical exam is normal. Outside labs reviewed (scanned into EPIC) with normal glucose, excellent lipid profile and otherwise normal values. She is current with gyn- cervical and breast cancer screening. Immunizations are brought up to date. Annual flu shot strongly recommended.  In summary - a delightful, healthy woman who invests in her health and shepherds her family's health as well as providing professional health services. She is asked to return in 3 years or as needed. In the interval routine medications can be refilled without an office visit.

## 2012-12-25 NOTE — Assessment & Plan Note (Signed)
Very little trouble with symptoms.  Plan Continue prn short-acting bronchodilator (albuterol) as needed.  If needing to use SABA more than twice a week, if having nocturnal symptoms more than twice a month or needing medical attention more than twice a year will change to LABA - long acting bronchodilator possibly with steroid vs steroid inhaler alone.

## 2013-02-11 ENCOUNTER — Ambulatory Visit (INDEPENDENT_AMBULATORY_CARE_PROVIDER_SITE_OTHER): Payer: BC Managed Care – PPO | Admitting: Sports Medicine

## 2013-02-11 VITALS — BP 120/78 | Ht 70.0 in | Wt 155.0 lb

## 2013-02-11 DIAGNOSIS — M25561 Pain in right knee: Secondary | ICD-10-CM | POA: Insufficient documentation

## 2013-02-11 DIAGNOSIS — M25569 Pain in unspecified knee: Secondary | ICD-10-CM

## 2013-02-11 NOTE — Progress Notes (Signed)
Chief complaint: Right knee pain  History of present illness: Patient is a 37 year old female who is an avid fitness enthusiast coming in with acute onset of right knee pain.  Patient states 4 days ago she was at a playground with her kids and went down to pick N2 and a very deep squat where her bottom hip her heels and when she went to pick her child up and her right knee locked. Patient was unable to extend the knee. The patient went home in her physical therapist of a husband tried different techniques but unable to get to extend.  Patient denies that there was any swelling or any numbness. Patient does give a past medical history approximately 15 years ago of having a patella dislocation of the same knee. Patient states it is felt very different. Patient states that early in the morning the next day patient was able to extend the knee but did not have any swelling and has been able to do all her regular activities since that time. Patient denies any significant soreness but does notice a twinge when she does squats deeply. Patient states that most of the pain was on the medial aspect of the knee and called and more of a sharp sensation when it was occurring but now has more of a dull sensation on the superior lateral aspect of the knee.  Past Medical History  Diagnosis Date  . Asthma   . Dysplastic nevi     excised lesions: Left UE, Right LE   Past Surgical History  Procedure Laterality Date  . Orthodontic    . G2p2     Family History  Problem Relation Age of Onset  . Cancer Father     melanoma  . Hypertension Brother   . Hypertension Paternal Grandfather   . Heart disease Paternal Grandfather   . Cancer Paternal Grandmother     LUNG   . Cancer Maternal Grandmother     breast   History   Social History  . Marital Status: Married    Spouse Name: N/A    Number of Children: 2  . Years of Education: 12+   Occupational History  . yog instructor    Social History Main Topics  .  Smoking status: Never Smoker   . Smokeless tobacco: Never Used  . Alcohol Use: No  . Drug Use: No     Comment: former THC  . Sexually Active: Yes -- Female partner(s)    Birth Control/ Protection: Condom   Other Topics Concern  . 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 No h/o abuse.    Physical exam Blood pressure 120/78, height 5\' 10"  (1.778 m), weight 155 lb (70.308 kg). General: No apparent distress alert and oriented x3 mood and affect normal Respiratory: Patient's speak in full sentences and does not appear short of breath Skin: Warm dry intact with no signs of infection or rash Neuro: Cranial nerves II through XII are intact, neurovascularly intact in all extremities with 2+ DTRs and 2+ pulses. Right knee exam: On inspection there is no gross deformity or any effusion. Patient does have full range of motion with minimal crepitus. Patient is nontender over the medial or lateral joint lines. All ligaments appeared to be intact the patient does have mild laxity of the PCL compared to the contralateral side. Patient has a negative McMurray's and a negative Thessaly.  Musculoskeletal ultrasound was performed and interpreted by me today. Ultrasound showed normal patella tendon with trace suprapatellar effusion. 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 The meniscus medial and lateral visualized no tears or any hypoechoic changes noted.

## 2013-02-11 NOTE — Assessment & Plan Note (Signed)
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 Patient does not have any significant findings that could be re\re exacerbated today. Patient has not had any swelling which makes this likely not in acute OCD. Likely this is secondary from her patella dislocation greater than 15 years ago. In addition to this patient was given a compression sleeve and we discussed how to change to different workouts in physical activities to decrease the amount of flexion at the right knee that could cause this to get stuck between the femoral condyle and the patella. If occurs more frequent then would likely need surgical intervention.

## 2013-02-11 NOTE — Patient Instructions (Signed)
Very nice to meet you On ultrasound today, we saw a retro-patellar OCD.  It appears that this is old and probably would have gotten caught and locked your knee.  Try the knee compression. Wear with activity and the hour afterward.  Concentrate on VMO strengthening.  If this continues to be a problem then come back and we can try injection or you may need to have it removed if it happens very frequently (this is not likely though).

## 2013-05-26 ENCOUNTER — Telehealth: Payer: Self-pay | Admitting: *Deleted

## 2013-05-26 NOTE — Telephone Encounter (Signed)
Ok to provide immunization record. Letter done and on back counter.

## 2013-05-26 NOTE — Telephone Encounter (Signed)
Spoke with pt advised forms ready for pick up under Amy Bautista

## 2013-05-26 NOTE — Telephone Encounter (Signed)
Pt called requesting a copy of her immunizations and a letter stating she has had Chicken Pox.  She needs this to turn into [facility] in order to do observation hours.  Please advise

## 2013-07-14 ENCOUNTER — Ambulatory Visit (INDEPENDENT_AMBULATORY_CARE_PROVIDER_SITE_OTHER): Payer: BC Managed Care – PPO | Admitting: Internal Medicine

## 2013-07-14 ENCOUNTER — Encounter: Payer: Self-pay | Admitting: Internal Medicine

## 2013-07-14 VITALS — BP 122/78 | HR 72 | Temp 97.2°F | Ht 70.0 in | Wt 165.0 lb

## 2013-07-14 DIAGNOSIS — J039 Acute tonsillitis, unspecified: Secondary | ICD-10-CM

## 2013-07-14 DIAGNOSIS — IMO0002 Reserved for concepts with insufficient information to code with codable children: Secondary | ICD-10-CM | POA: Insufficient documentation

## 2013-07-14 MED ORDER — CEFUROXIME AXETIL 250 MG PO TABS: 250.0000 mg | ORAL_TABLET | Freq: Two times a day (BID) | ORAL | Status: DC

## 2013-07-14 NOTE — Progress Notes (Signed)
  Subjective:    Patient ID: Amy Bautista, female    DOB: 06/28/1976, 37 y.o.   MRN: [id number]  Sore Throat  This is a new problem. The current episode started in the past 7 days (2-3 days). The problem has been unchanged. Sore throat worse side: may have scratched L tonsil with a piece of popcorn  the other day; pt had a little ST prior. There has been no fever. The pain is mild. Associated symptoms include congestion. Pertinent negatives include no ear pain, hoarse voice, neck pain, shortness of breath, stridor, swollen glands or trouble swallowing. She has had no exposure to strep. She has tried gargles for the symptoms. The treatment provided mild relief.      Review of Systems  Constitutional: Negative for fever, chills and activity change.  HENT: Positive for congestion, rhinorrhea, sneezing and postnasal drip. Negative for ear pain, hoarse voice, trouble swallowing and neck pain.   Respiratory: Negative for shortness of breath and stridor.        Objective:   Physical Exam  Constitutional: She appears well-developed and well-nourished. No distress.  HENT:  Mouth/Throat: Oropharynx is clear and moist. No oropharyngeal exudate.  B tonsils are enlarged; L is more irritated than R distally; no foreign body noted; finger exam is not revealing any hard objects  Eyes: No scleral icterus.  Neck: Normal range of motion. Neck supple. No JVD present. No tracheal deviation present. No thyromegaly present.  Pulmonary/Chest: No stridor.  Lymphadenopathy:    She has no cervical adenopathy.          Assessment & Plan:

## 2013-07-14 NOTE — Assessment & Plan Note (Addendum)
10/14 L tonsil irritation (possible URI and a recent scratch from popcorn) Gargle w/baking soda, salt Ceftin if not better ENT ref Dr Pollyann Kennedy to eval enlarged tonsils w/chronic irritation

## 2013-07-22 ENCOUNTER — Telehealth: Payer: Self-pay | Admitting: Internal Medicine

## 2013-07-22 NOTE — Telephone Encounter (Signed)
K  No visit needed.

## 2013-07-22 NOTE — Telephone Encounter (Signed)
Ms. Amy Bautista returned my call.  She went to a [facility] at [facility] last night.  She tested positive for Strep.  They put her on Penicilin 500 mg three times per day for 10 days.  She is going out of town on Thursday.   She said she will still come in today if needed.  If not needed she will call back if not better.

## 2013-07-23 ENCOUNTER — Telehealth: Payer: Self-pay | Admitting: Internal Medicine

## 2013-07-23 NOTE — Telephone Encounter (Signed)
Recd records from [facility], Forwarding 3pgs to Dr.Norins

## 2013-07-31 ENCOUNTER — Encounter: Payer: Self-pay | Admitting: Internal Medicine

## 2013-07-31 ENCOUNTER — Ambulatory Visit (INDEPENDENT_AMBULATORY_CARE_PROVIDER_SITE_OTHER): Payer: BC Managed Care – PPO | Admitting: Internal Medicine

## 2013-07-31 VITALS — BP 130/70 | HR 84 | Temp 98.2°F | Wt 163.0 lb

## 2013-07-31 DIAGNOSIS — J309 Allergic rhinitis, unspecified: Secondary | ICD-10-CM

## 2013-07-31 DIAGNOSIS — J029 Acute pharyngitis, unspecified: Secondary | ICD-10-CM

## 2013-07-31 MED ORDER — FLUTICASONE PROPIONATE 50 MCG/ACT NA SUSP: NASAL | Status: DC

## 2013-07-31 NOTE — Progress Notes (Signed)
  Subjective:    Patient ID: Amy Bautista, female    DOB: Nov 23, 1975, 37 y.o.   MRN: [id number]  HPI Amy Bautista was seen about two weeks ago by Dr. Posey Rea for sore throat. She was prescribed ceftin to use if she got worse. She never took the ceftin but when she go worse she went to [facility] where she had a positive rapid strep test. She was treated with a 10 day course of pen G.she did feel better with resolution of her symptoms. She then made a trip to the [facility] and concomittantly her allergies have been very active. She has developed a sore left ear and a recurrent sore throat and presents for evaluation.  PMH, FamHx and SocHx reviewed for any changes and relevance.  Current Outpatient Prescriptions on File Prior to Visit  Medication Sig Dispense Refill  . cefUROXime (CEFTIN) 250 MG tablet Take 1 tablet (250 mg total) by mouth 2 (two) times daily.  20 tablet  0  . Cetirizine HCl (ZYRTEC PO) Take by mouth.        . Prenatal Vit-Fe Psac Cmplx-FA (PRENATAL MULTIVITAMIN) 60-1 MG tablet Take 1 tablet by mouth daily.        Marland Kitchen PULMICORT FLEXHALER 180 MCG/ACT inhaler INHALE 1 PUFF INTO THE LUNGS 2 TIMES A DAY  1 each  6  . VENTOLIN HFA 108 (90 BASE) MCG/ACT inhaler USE 2 PUFFS EVERY 4 HOURS AS NEEDED  18 g  3   Current Facility-Administered Medications on File Prior to Visit  Medication Dose Route Frequency Provider Last Rate Last Dose  . TDaP (BOOSTRIX) injection 0.5 mL  0.5 mL Intramuscular Once Jacques Navy, MD          Review of Systems System review is negative for any constitutional, cardiac, pulmonary, GI or neuro symptoms or complaints other than as described in the HPI.     Objective:   Physical Exam Filed Vitals:   07/31/13 0838  BP: 130/70  Pulse: 84  Temp: 98.2 F (36.8 C)   Wt Readings from Last 3 Encounters:  07/31/13 163 lb (73.936 kg)  07/14/13 165 lb (74.844 kg)  02/11/13 155 lb (70.308 kg)   Gen'l - WNWD woman in no distress HEENT - TM's  pearly, several small excoriations in left EAC. Throat without erythema, edema or exudate. Tonsils normal size Node - negative submandibular and cervical Lungs clear  Rapid strep test - negative    Assessment & Plan:  Sore throat - no clinical signs of bacterial pharyngitis, step test neg. Suspect combination of allergies and possibly viral pharyngitis.  Plan  No antibiotics  Resume flonase nasal  Vit C, Ecchinacea, fluids, rest.

## 2013-08-13 ENCOUNTER — Other Ambulatory Visit: Payer: Self-pay

## 2013-08-17 ENCOUNTER — Other Ambulatory Visit: Payer: Self-pay | Admitting: Internal Medicine

## 2013-08-26 ENCOUNTER — Ambulatory Visit (INDEPENDENT_AMBULATORY_CARE_PROVIDER_SITE_OTHER): Payer: BC Managed Care – PPO | Admitting: Gynecology

## 2013-08-26 ENCOUNTER — Encounter: Payer: Self-pay | Admitting: Gynecology

## 2013-08-26 ENCOUNTER — Telehealth: Payer: Self-pay | Admitting: *Deleted

## 2013-08-26 DIAGNOSIS — N63 Unspecified lump in unspecified breast: Secondary | ICD-10-CM

## 2013-08-26 DIAGNOSIS — N6310 Unspecified lump in the right breast, unspecified quadrant: Secondary | ICD-10-CM

## 2013-08-26 NOTE — Telephone Encounter (Signed)
Orders placed for the below, breast center will contact pt to schedule.

## 2013-08-26 NOTE — Telephone Encounter (Signed)
Message copied by Aura Camps on Wed Aug 26, 2013 12:07 PM ------      Message from: Dara Lords      Created: Wed Aug 26, 2013 10:44 AM       Schedule diagnostic mammogram and ultrasound reference right breast mass 11 to 12:00 off the areola ------

## 2013-08-26 NOTE — Progress Notes (Signed)
Patient ID: Amy Bautista, female   DOB: [birth year], 37 y.o.   MRN: [id number] Patient presents having recently noticed a lump in her right breast. She admits to not doing self breast exams on a regular basis but never noticed this area previously. She is just starting her period today. Maternal grandmother with breast cancer. No other history in the family.  Exam with Berenice Bouton Both breasts examined lying and sitting. Left without masses retractions discharge adenopathy. Right with soft 1 cm area 11 to 12:00 off of areola consistent with fibroglandular changes. No overlying skin changes nipple discharge or axillary adenopathy. Physical Exam  Pulmonary/Chest:     Assessment and plan: Small mass right breast which I think is normal breast tissue. Recommend mammography / ultrasound for better definition the patient agrees with this and we will schedule for her. Assuming negative then we'll continue with self breast exams and report any changes. Certainly if any issues with the testing then we'll pursue a more aggressive evaluation.

## 2013-08-26 NOTE — Patient Instructions (Signed)
Office will call you to schedule a mammogram and ultrasound.

## 2013-08-28 NOTE — Telephone Encounter (Signed)
Appt. 09/14/13 @ 8:00 am

## 2013-09-14 ENCOUNTER — Ambulatory Visit
Admission: RE | Admit: 2013-09-14 | Discharge: 2013-09-14 | Disposition: A | Discharge status: Home / Self Care | Payer: BC Managed Care – PPO | Source: Ambulatory Visit | Attending: Gynecology | Admitting: Gynecology

## 2013-09-14 DIAGNOSIS — N6310 Unspecified lump in the right breast, unspecified quadrant: Secondary | ICD-10-CM

## 2013-09-22 ENCOUNTER — Other Ambulatory Visit: Payer: Self-pay

## 2013-09-22 MED ORDER — BUDESONIDE 180 MCG/ACT IN AEPB: 1.0000 | INHALATION_SPRAY | Freq: Two times a day (BID) | RESPIRATORY_TRACT | Status: DC

## 2013-09-23 ENCOUNTER — Encounter: Payer: Self-pay | Admitting: Women's Health

## 2013-11-04 ENCOUNTER — Ambulatory Visit (INDEPENDENT_AMBULATORY_CARE_PROVIDER_SITE_OTHER): Payer: BC Managed Care – PPO | Admitting: Gynecology

## 2013-11-04 ENCOUNTER — Encounter: Payer: Self-pay | Admitting: Gynecology

## 2013-11-04 ENCOUNTER — Other Ambulatory Visit (HOSPITAL_COMMUNITY)
Admission: RE | Admit: 2013-11-04 | Discharge: 2013-11-04 | Disposition: A | Discharge status: Home / Self Care | Payer: BC Managed Care – PPO | Source: Ambulatory Visit | Attending: Gynecology | Admitting: Gynecology

## 2013-11-04 VITALS — BP 120/66 | Ht 69.5 in | Wt 164.0 lb

## 2013-11-04 DIAGNOSIS — Z309 Encounter for contraceptive management, unspecified: Secondary | ICD-10-CM

## 2013-11-04 DIAGNOSIS — Z01419 Encounter for gynecological examination (general) (routine) without abnormal findings: Secondary | ICD-10-CM | POA: Insufficient documentation

## 2013-11-04 DIAGNOSIS — Z1151 Encounter for screening for human papillomavirus (HPV): Secondary | ICD-10-CM | POA: Insufficient documentation

## 2013-11-04 NOTE — Patient Instructions (Signed)
Followup for Mirena IUD if you choose. Otherwise followup in one year for annual exam.

## 2013-11-04 NOTE — Addendum Note (Signed)
Addended by: Nelva Nay on: 11/04/2013 11:18 AM   Modules accepted: Orders

## 2013-11-04 NOTE — Progress Notes (Signed)
TORRIN FREIN [place] 04/16/1976 [id number]        38 y.o.  G2P2 for annual exam.  Several issues below.  Past medical history,surgical history, problem list, medications, allergies, family history and social history were all reviewed and documented in the EPIC chart.  ROS:  Performed and pertinent positives and negatives are included in the history, assessment and plan .  Exam: Kim assistant Filed Vitals:   11/04/13 1020  BP: 120/66  Height: 5' 9.5" (1.765 m)  Weight: 164 lb (74.39 kg)   General appearance  Normal Skin grossly normal Head/Neck normal with no cervical or supraclavicular adenopathy thyroid normal Lungs  clear Cardiac RR, without RMG Abdominal  soft, nontender, without masses, organomegaly or hernia Breasts  examined lying and sitting without masses, retractions, discharge or axillary adenopathy. Pelvic  Ext/BUS/vagina  Normal  Cervix  Normal. Pap/HPV  Uterus  retroverted, normal size, shape and contour, midline and mobile nontender   Adnexa  Without masses or tenderness    Anus and perineum  Normal   Rectovaginal  Normal sphincter tone without palpated masses or tenderness.    Assessment/Plan:  38 y.o. G2P2 female for annual exam regular menses, condom contraception.   1. Contraceptive management. Patient using condoms. I reviewed the failure risk and availability of plan B. Alternatives discussed. Patient interested in possible Mirena IUD. I reviewed placement procedure, mechanism of action and risks to include uterine perforation/migration requiring surgery to remove, hormone absorption with side effects such as breast tenderness at the headaches PMS symptoms, infection immediate or delayed, failure with pregnancy and need to have it replaced a 5 year interval. Patient has read through the literature is thinking about and will followup if she chooses. 2. Breast nodularity. Recent evaluation for right breast nodularity included a negative mammogram and ultrasound.  Patient notes that the nodularity seems to have decreased and it does seem to fluctuate throughout the month. My exam today is normal and I am no longer able to feel the area in her right breast as previously. We'll continue with suppressed exams monthly as long as this area remains stable will follow. If there is any change she knows to represent for further evaluation.  Plan followup mammogram at age 18. SBE monthly reviewed. 3. Pap smear 2012. Pap/HPV today. No history of significant abnormal Pap smears previously. Plan repeat Pap smear in 3-5 your interval. 4. Health maintenance. Patient reports routine blood work done through [facility]. Followup for contraceptive management otherwise annually.   Note: This document was prepared with digital dictation and possible smart phrase technology. Any transcriptional errors that result from this process are unintentional.   [name] MD, 11:02 AM 11/04/2013

## 2014-05-31 ENCOUNTER — Other Ambulatory Visit: Payer: Self-pay

## 2014-05-31 ENCOUNTER — Other Ambulatory Visit: Payer: Self-pay | Admitting: *Deleted

## 2014-05-31 MED ORDER — ALBUTEROL SULFATE HFA 108 (90 BASE) MCG/ACT IN AERS: 2.0000 | INHALATION_SPRAY | RESPIRATORY_TRACT | Status: DC | PRN

## 2014-05-31 NOTE — Telephone Encounter (Signed)
Pt called requesting refill for ventolin inhaler, rx has been refilled and sent to pharm, message has been left on pt's machine notifying her that rx is at [facility]

## 2014-07-23 ENCOUNTER — Other Ambulatory Visit: Payer: Self-pay

## 2014-08-09 ENCOUNTER — Encounter: Payer: Self-pay | Admitting: Gynecology

## 2014-11-17 ENCOUNTER — Other Ambulatory Visit: Payer: Self-pay | Admitting: *Deleted

## 2014-11-17 MED ORDER — ALBUTEROL SULFATE HFA 108 (90 BASE) MCG/ACT IN AERS: 2.0000 | INHALATION_SPRAY | RESPIRATORY_TRACT | Status: DC | PRN

## 2015-03-23 ENCOUNTER — Ambulatory Visit (INDEPENDENT_AMBULATORY_CARE_PROVIDER_SITE_OTHER): Payer: BLUE CROSS/BLUE SHIELD | Admitting: Internal Medicine

## 2015-03-23 ENCOUNTER — Other Ambulatory Visit (INDEPENDENT_AMBULATORY_CARE_PROVIDER_SITE_OTHER): Payer: BLUE CROSS/BLUE SHIELD

## 2015-03-23 ENCOUNTER — Encounter: Payer: Self-pay | Admitting: Internal Medicine

## 2015-03-23 VITALS — BP 128/72 | HR 98 | Ht 70.0 in | Wt 164.0 lb

## 2015-03-23 DIAGNOSIS — Z0000 Encounter for general adult medical examination without abnormal findings: Secondary | ICD-10-CM

## 2015-03-23 DIAGNOSIS — J4520 Mild intermittent asthma, uncomplicated: Secondary | ICD-10-CM

## 2015-03-23 DIAGNOSIS — E034 Atrophy of thyroid (acquired): Secondary | ICD-10-CM | POA: Diagnosis not present

## 2015-03-23 DIAGNOSIS — E559 Vitamin D deficiency, unspecified: Secondary | ICD-10-CM

## 2015-03-23 DIAGNOSIS — E038 Other specified hypothyroidism: Secondary | ICD-10-CM

## 2015-03-23 LAB — BASIC METABOLIC PANEL
BUN: 10 mg/dL (ref 6–23)
CHLORIDE: 107 meq/L (ref 96–112)
CO2: 26 meq/L (ref 19–32)
CREATININE: 0.74 mg/dL (ref 0.40–1.20)
Calcium: 9.2 mg/dL (ref 8.4–10.5)
GFR: 92.6 mL/min (ref >60.00)
Glucose, Bld: 89 mg/dL (ref 70–99)
Potassium: 4.5 mEq/L (ref 3.5–5.1)
SODIUM: 137 meq/L (ref 135–145)

## 2015-03-23 LAB — CBC WITH DIFFERENTIAL/PLATELET
BASOS ABS: 0.1 10*3/uL (ref 0.0–0.1)
BASOS PCT: 0.9 % (ref 0.0–3.0)
Eosinophils Absolute: 0.4 10*3/uL (ref 0.0–0.7)
Eosinophils Relative: 6.6 % — ABNORMAL HIGH (ref 0.0–5.0)
HCT: 36.5 % (ref 36.0–46.0)
Hemoglobin: 12.4 g/dL (ref 12.0–15.0)
LYMPHS PCT: 21.3 % (ref 12.0–46.0)
Lymphs Abs: 1.3 10*3/uL (ref 0.7–4.0)
MCHC: 33.9 g/dL (ref 30.0–36.0)
MCV: 89.5 fl (ref 78.0–100.0)
Monocytes Absolute: 0.4 10*3/uL (ref 0.1–1.0)
Monocytes Relative: 6.8 % (ref 3.0–12.0)
NEUTROS PCT: 64.4 % (ref 43.0–77.0)
Neutro Abs: 4.1 10*3/uL (ref 1.4–7.7)
PLATELETS: 211 10*3/uL (ref 150.0–400.0)
RBC: 4.08 Mil/uL (ref 3.87–5.11)
RDW: 13.6 % (ref 11.5–15.5)
WBC: 6.3 10*3/uL (ref 4.0–10.5)

## 2015-03-23 LAB — URINALYSIS, ROUTINE W REFLEX MICROSCOPIC
Bilirubin Urine: NEGATIVE
Ketones, ur: NEGATIVE
Nitrite: NEGATIVE
Specific Gravity, Urine: 1.01 (ref 1.000–1.030)
Total Protein, Urine: NEGATIVE
Urine Glucose: NEGATIVE
Urobilinogen, UA: 0.2 (ref 0.0–1.0)
pH: 7 (ref 5.0–8.0)

## 2015-03-23 LAB — HEPATIC FUNCTION PANEL
ALBUMIN: 4.5 g/dL (ref 3.5–5.2)
ALT: 9 U/L (ref 0–35)
AST: 17 U/L (ref 0–37)
Alkaline Phosphatase: 34 U/L — ABNORMAL LOW (ref 39–117)
Bilirubin, Direct: 0.3 mg/dL (ref 0.0–0.3)
Total Bilirubin: 1.3 mg/dL — ABNORMAL HIGH (ref 0.2–1.2)
Total Protein: 7 g/dL (ref 6.0–8.3)

## 2015-03-23 LAB — VITAMIN D 25 HYDROXY (VIT D DEFICIENCY, FRACTURES): VITD: 17.9 ng/mL — AB (ref 30.00–100.00)

## 2015-03-23 LAB — VITAMIN B12: VITAMIN B 12: 562 pg/mL (ref 211–911)

## 2015-03-23 LAB — TSH: TSH: 9.5 u[IU]/mL — ABNORMAL HIGH (ref 0.35–4.50)

## 2015-03-23 MED ORDER — ERGOCALCIFEROL 1.25 MG (50000 UT) PO CAPS: 50000.0000 [IU] | ORAL_CAPSULE | ORAL | Status: DC

## 2015-03-23 MED ORDER — BUDESONIDE 180 MCG/ACT IN AEPB: 1.0000 | INHALATION_SPRAY | Freq: Two times a day (BID) | RESPIRATORY_TRACT | Status: DC

## 2015-03-23 MED ORDER — ALBUTEROL SULFATE HFA 108 (90 BASE) MCG/ACT IN AERS: 2.0000 | INHALATION_SPRAY | Freq: Four times a day (QID) | RESPIRATORY_TRACT | Status: DC | PRN

## 2015-03-23 MED ORDER — LEVOTHYROXINE SODIUM 25 MCG PO TABS: 25.0000 ug | ORAL_TABLET | Freq: Every day | ORAL | Status: DC

## 2015-03-23 MED ORDER — VITAMIN D 1000 UNITS PO TABS: 1000.0000 [IU] | ORAL_TABLET | Freq: Every day | ORAL | Status: DC

## 2015-03-23 MED ORDER — VITAMIN D 1000 UNITS PO TABS: 2000.0000 [IU] | ORAL_TABLET | Freq: Every day | ORAL | Status: AC

## 2015-03-23 NOTE — Assessment & Plan Note (Signed)
6/16 - start Levothroid Labs in 4-6 wks RTC 2 mo

## 2015-03-23 NOTE — Assessment & Plan Note (Signed)
6/16 Start Vit D prescription 50000 iu weekly (Rx emailed to your pharmacy) followed by over-the-counter Vit D 2000 iu daily.

## 2015-03-23 NOTE — Progress Notes (Signed)
Pre visit review using our clinic review tool, if applicable. No additional management support is needed unless otherwise documented below in the visit note. 

## 2015-03-23 NOTE — Progress Notes (Addendum)
   Subjective:    Patient ID: Amy Bautista, female    DOB: 09/06/1976, 39 y.o.   MRN: [id number]  HPI  The patient is here for a wellness exam. The patient has been doing well overall without major physical or psychological issues going on lately. 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 Negative for chills, activity change, appetite change and unexpected weight change.  HENT: Negative for congestion, mouth sores and sinus pressure.   Eyes: Negative for visual disturbance.  Respiratory: Negative for cough and chest tightness.   Gastrointestinal: Negative for nausea and abdominal pain.  Genitourinary: Negative for frequency, difficulty urinating and vaginal pain.  Musculoskeletal: Negative for back pain and gait problem.  Skin: Negative for pallor and rash.  Neurological: Negative for dizziness, tremors, weakness, numbness and headaches.  Psychiatric/Behavioral: Negative for confusion and sleep disturbance. The patient is not nervous/anxious.        Objective:   Physical Exam  Constitutional: She appears well-developed. No distress.  HENT:  Head: Normocephalic.  Right Ear: External ear normal.  Left Ear: External ear normal.  Nose: Nose normal.  Mouth/Throat: Oropharynx is clear and moist.  Eyes: Conjunctivae are normal. Pupils are equal, round, and reactive to light. Right eye exhibits no discharge. Left eye exhibits no discharge.  Neck: Normal range of motion. Neck supple. No JVD present. No tracheal deviation present. No thyromegaly present.  Cardiovascular: Normal rate, regular rhythm and normal heart sounds.   Pulmonary/Chest: No stridor. No respiratory distress. She has no wheezes.  Abdominal: Soft. Bowel sounds are normal. She exhibits no distension and no mass. There is no tenderness. There is no rebound and no guarding.  Musculoskeletal: She exhibits no edema or  tenderness.  Lymphadenopathy:    She has no cervical adenopathy.  Neurological: She displays normal reflexes. No cranial nerve deficit. She exhibits normal muscle tone. Coordination normal.  Skin: No rash noted. No erythema.  Psychiatric: She has a normal mood and affect. Her behavior is normal. Judgment and thought content normal.          Assessment & Plan:

## 2015-03-24 ENCOUNTER — Encounter: Payer: Self-pay | Admitting: Internal Medicine

## 2015-03-24 ENCOUNTER — Telehealth: Payer: Self-pay | Admitting: Internal Medicine

## 2015-03-24 NOTE — Telephone Encounter (Signed)
Stefannie C., CMA called pt to follow up on pt emails. She also informed pt the TB gold, Hep surf AB and Varicella titers were not drawn/analyzed. Pt is aware to come back to [facility] lab to have those tubes drawn and to come back to [facility] lab in 6 weeks to recheck TSH, Ft4 per MD.

## 2015-03-24 NOTE — Telephone Encounter (Signed)
Patient states that she got message from Dr. Alain Marion to come back in six weeks for more labs.  Patient would like to know if she needs to just go to lab or if she needs to schedule an appointment.  Patient states she started meds today that he prescribed for her.

## 2015-03-24 NOTE — Telephone Encounter (Signed)
See 03/24/15 patient email.

## 2015-03-25 ENCOUNTER — Telehealth: Payer: Self-pay

## 2015-03-25 ENCOUNTER — Other Ambulatory Visit (INDEPENDENT_AMBULATORY_CARE_PROVIDER_SITE_OTHER): Payer: BLUE CROSS/BLUE SHIELD

## 2015-03-25 ENCOUNTER — Other Ambulatory Visit: Payer: Self-pay | Admitting: *Deleted

## 2015-03-25 DIAGNOSIS — E038 Other specified hypothyroidism: Secondary | ICD-10-CM | POA: Diagnosis not present

## 2015-03-25 DIAGNOSIS — Z0000 Encounter for general adult medical examination without abnormal findings: Secondary | ICD-10-CM

## 2015-03-25 DIAGNOSIS — E034 Atrophy of thyroid (acquired): Secondary | ICD-10-CM | POA: Diagnosis not present

## 2015-03-25 DIAGNOSIS — R899 Unspecified abnormal finding in specimens from other organs, systems and tissues: Secondary | ICD-10-CM

## 2015-03-25 LAB — T4, FREE: Free T4: 0.75 ng/dL (ref 0.60–1.60)

## 2015-03-25 LAB — TSH: TSH: 11.25 u[IU]/mL — ABNORMAL HIGH (ref 0.35–4.50)

## 2015-03-25 LAB — HEPATITIS B SURFACE ANTIBODY, QUANTITATIVE: Hepatitis B-Post: 0 m[IU]/mL

## 2015-03-25 NOTE — Telephone Encounter (Signed)
Pt came to the lab today for restick.  She came to the office and had some questions about the TSH labs and what they ment.   Pt is informed and understands the results.

## 2015-03-27 ENCOUNTER — Telehealth: Payer: Self-pay | Admitting: Internal Medicine

## 2015-03-27 ENCOUNTER — Other Ambulatory Visit: Payer: Self-pay | Admitting: Internal Medicine

## 2015-03-27 DIAGNOSIS — R7989 Other specified abnormal findings of blood chemistry: Secondary | ICD-10-CM

## 2015-03-27 LAB — QUANTIFERON TB GOLD ASSAY (BLOOD)
Interferon Gamma Release Assay: NEGATIVE
Quantiferon Nil Value: 0.03 IU/mL
Quantiferon Tb Ag Minus Nil Value: 0.01 IU/mL
TB Ag value: 0.04 IU/mL

## 2015-03-27 LAB — VARICELLA ZOSTER ANTIBODY, IGG: Varicella IgG: 138.6 Index — ABNORMAL HIGH (ref <135.00)

## 2015-03-28 NOTE — Telephone Encounter (Signed)
Statement of health letter dated 03/27/15 upfront for p/u. Pt informed

## 2015-03-30 ENCOUNTER — Ambulatory Visit: Payer: BLUE CROSS/BLUE SHIELD | Admitting: Internal Medicine

## 2015-03-30 ENCOUNTER — Encounter: Payer: Self-pay | Admitting: Internal Medicine

## 2015-03-30 ENCOUNTER — Ambulatory Visit (INDEPENDENT_AMBULATORY_CARE_PROVIDER_SITE_OTHER): Payer: BLUE CROSS/BLUE SHIELD | Admitting: Internal Medicine

## 2015-03-30 VITALS — BP 160/82 | HR 92 | Wt 164.0 lb

## 2015-03-30 DIAGNOSIS — F411 Generalized anxiety disorder: Secondary | ICD-10-CM

## 2015-03-30 DIAGNOSIS — E559 Vitamin D deficiency, unspecified: Secondary | ICD-10-CM | POA: Diagnosis not present

## 2015-03-30 DIAGNOSIS — N943 Premenstrual tension syndrome: Secondary | ICD-10-CM | POA: Diagnosis not present

## 2015-03-30 DIAGNOSIS — R7989 Other specified abnormal findings of blood chemistry: Secondary | ICD-10-CM | POA: Diagnosis not present

## 2015-03-30 DIAGNOSIS — Z23 Encounter for immunization: Secondary | ICD-10-CM

## 2015-03-30 MED ORDER — ALPRAZOLAM 0.25 MG PO TABS: 0.2500 mg | ORAL_TABLET | Freq: Two times a day (BID) | ORAL | Status: DC

## 2015-03-30 MED ORDER — LEVOTHROID 25 MCG PO TABS: 25.0000 ug | ORAL_TABLET | Freq: Every day | ORAL | Status: DC

## 2015-03-30 NOTE — Assessment & Plan Note (Addendum)
03/2015 ?early hypothyroidism Will consult Dr Dwyane Dee Low dose Synthroid if it would help w/fatigue Labs in 4-6 wks

## 2015-03-30 NOTE — Progress Notes (Deleted)
Pre visit review using our clinic review tool, if applicable. No additional management support is needed unless otherwise documented below in the visit note. 

## 2015-03-30 NOTE — Progress Notes (Deleted)
   Subjective:    Patient ID: Amy Bautista, female    DOB: 1975/12/26, 39 y.o.   MRN: [id number]  HPI   The patient has been doing well overall without major physical or psychological issues going on lately. 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 H/o panic attacks, PMS.  Review of Systems     Objective:   Physical Exam        Assessment & Plan:

## 2015-03-31 ENCOUNTER — Encounter: Payer: Self-pay | Admitting: Internal Medicine

## 2015-03-31 DIAGNOSIS — F411 Generalized anxiety disorder: Secondary | ICD-10-CM | POA: Insufficient documentation

## 2015-03-31 NOTE — Assessment & Plan Note (Signed)
Start Vit D

## 2015-03-31 NOTE — Assessment & Plan Note (Signed)
Discussed SSRI option

## 2015-03-31 NOTE — Assessment & Plan Note (Signed)
Situational sx's Xanax prn  Potential benefits of a prn benzodiazepines  use as well as potential risks  and complications were explained to the patient and were aknowledged.

## 2015-03-31 NOTE — Progress Notes (Signed)
   Subjective:    Patient ID: Amy Bautista, female    DOB: 11-15-1975, 39 y.o.   MRN: [id number]  HPI  The patient is here to  Discuss abn TSH, fatigue. C/o PMS, anxiety sx's.  Wt Readings from Last 3 Encounters:  03/30/15 164 lb (74.39 kg)  03/23/15 164 lb (74.39 kg)  11/04/13 164 lb (74.39 kg)   BP Readings from Last 3 Encounters:  03/30/15 160/82  03/23/15 128/72  11/04/13 120/66      Review of Systems  Constitutional: Positive for fatigue. Negative for chills, activity change, appetite change and unexpected weight change.  HENT: Negative for congestion, mouth sores and sinus pressure.   Eyes: Negative for visual disturbance.  Respiratory: Negative for cough and chest tightness.   Gastrointestinal: Negative for nausea and abdominal pain.  Genitourinary: Negative for frequency, difficulty urinating and vaginal pain.  Musculoskeletal: Negative for back pain and gait problem.  Skin: Negative for pallor and rash.  Neurological: Negative for dizziness, tremors, weakness, numbness and headaches.  Psychiatric/Behavioral: Negative for confusion and sleep disturbance. The patient is not nervous/anxious.        Objective:   Physical Exam  Constitutional: She appears well-developed. No distress.  HENT:  Head: Normocephalic.  Right Ear: External ear normal.  Left Ear: External ear normal.  Nose: Nose normal.  Mouth/Throat: Oropharynx is clear and moist.  Eyes: Conjunctivae are normal. Pupils are equal, round, and reactive to light. Right eye exhibits no discharge. Left eye exhibits no discharge.  Neck: Normal range of motion. Neck supple. No JVD present. No tracheal deviation present. No thyromegaly present.  Cardiovascular: Normal rate, regular rhythm and normal heart sounds.   Pulmonary/Chest: No stridor. No respiratory distress. She has no wheezes.  Abdominal: Soft. Bowel sounds are normal. She exhibits no distension and no mass. There is no tenderness. There is no  rebound and no guarding.  Musculoskeletal: She exhibits no edema or tenderness.  Lymphadenopathy:    She has no cervical adenopathy.  Neurological: She displays normal reflexes. No cranial nerve deficit. She exhibits normal muscle tone. Coordination normal.  Skin: No rash noted. No erythema.  Psychiatric: Her behavior is normal. Judgment and thought content normal.  Not depressed  Lab Results  Component Value Date   WBC 6.3 03/23/2015   HGB 12.4 03/23/2015   HCT 36.5 03/23/2015   PLT 211.0 03/23/2015   GLUCOSE 89 03/23/2015   ALT 9 03/23/2015   AST 17 03/23/2015   NA 137 03/23/2015   K 4.5 03/23/2015   CL 107 03/23/2015   CREATININE 0.74 03/23/2015   BUN 10 03/23/2015   CO2 26 03/23/2015   TSH 11.25* 03/25/2015         Assessment & Plan:

## 2015-04-01 ENCOUNTER — Ambulatory Visit (INDEPENDENT_AMBULATORY_CARE_PROVIDER_SITE_OTHER): Payer: BLUE CROSS/BLUE SHIELD | Admitting: Endocrinology

## 2015-04-01 ENCOUNTER — Encounter: Payer: Self-pay | Admitting: Endocrinology

## 2015-04-01 VITALS — BP 138/90 | HR 105 | Temp 97.7°F | Resp 16 | Ht 70.0 in | Wt 163.8 lb

## 2015-04-01 DIAGNOSIS — E038 Other specified hypothyroidism: Secondary | ICD-10-CM

## 2015-04-01 DIAGNOSIS — E063 Autoimmune thyroiditis: Secondary | ICD-10-CM | POA: Insufficient documentation

## 2015-04-01 DIAGNOSIS — E039 Hypothyroidism, unspecified: Secondary | ICD-10-CM

## 2015-04-01 NOTE — Progress Notes (Signed)
Patient ID: Amy Bautista, female   DOB: 1976-02-17, 39 y.o.   MRN: [id number]            Reason for Appointment:  Hypothyroidism, new consultation   History of Present Illness:   She was recently seen by her primary care physician for a routine general exam She had a screening TSH done and was 9.5 She does not think she has any typical symptoms of fatigue, cold sensitivity, difficulty concentrating, dry skin, weight gain or recent hair loss. She sometimes does get a little tired and may feel like taking a nap but usually able to do her usual activities. She thinks she may get some tiredness because of working and taking care of 2 small children also She has had relatively thin hair since her last pregnancy but this has been stable.  She did have some hair loss after her last pregnancy temporarily           Her repeat TSH was 11.25 and she was recommended levothyroxine 25 g daily on 03/24/15 With starting this in the first few days she did not feel any different although may have been able to exercise more easily in the first few days However about 3 days ago she started feeling very anxious, jittery and felt her heart beating fast as well as not being able to concentrate on her studying. Overall does not feel any change in her energy level with starting her levothyroxine supplement Because of her not feeling well with the treatment she is here for consultation   Lab Results  Component Value Date   FREET4 0.75 03/25/2015   TSH 11.25* 03/25/2015   TSH 9.50* 03/23/2015     Past Medical History  Diagnosis Date  . Asthma   . Dysplastic nevi     excised lesions: Left UE, Right LE  . Allergy     Past Surgical History  Procedure Laterality Date  . Orthodontic    . G2p2    . Knee surgery      Arthroscopic    Family History  Problem Relation Age of Onset  . Cancer Father     melanoma  . Hypertension Brother   . Hypertension Paternal Grandfather   . Heart disease  Paternal Grandfather   . Cancer Paternal Grandmother     LUNG   . Breast cancer Maternal Grandmother     24's    Social History:  reports that she has never smoked. She has never used smokeless tobacco. She reports that she does not drink alcohol or use illicit drugs.  Allergies:  Allergies  Allergen Reactions  . Doxycycline     nausea  . Fish Allergy Itching and Nausea Only  . Shellfish Allergy       Medication List       This list is accurate as of: 04/01/15  9:50 AM.  Always use your most recent med list.               albuterol 108 (90 BASE) MCG/ACT inhaler  Commonly known as:  PROAIR HFA  Inhale 2 puffs into the lungs every 6 (six) hours as needed for wheezing or shortness of breath.     ALPRAZolam 0.25 MG tablet  Commonly known as:  XANAX  Take 1 tablet (0.25 mg total) by mouth 2 (two) times daily.     budesonide 180 MCG/ACT inhaler  Commonly known as:  PULMICORT FLEXHALER  Inhale 1 puff into the lungs 2 (two) times daily.  cholecalciferol 1000 UNITS tablet  Commonly known as:  VITAMIN D  Take 2 tablets (2,000 Units total) by mouth daily.     ergocalciferol 50000 UNITS capsule  Commonly known as:  VITAMIN D2  Take 1 capsule (50,000 Units total) by mouth once a week.     fluticasone 50 MCG/ACT nasal spray  Commonly known as:  FLONASE  Place 1 spray into both nostrils daily.     levothyroxine 25 MCG tablet  Commonly known as:  SYNTHROID, LEVOTHROID  Take 25 mcg by mouth daily before breakfast.     multivitamin tablet  Take 1 tablet by mouth daily.     ZYRTEC PO  Take by mouth.        Review of Systems:  No recent weight change  CARDIOLOGY: no history of high blood pressure.      Has not had any recent lipids  No results found for: CHOL, HDL, LDLCALC, LDLDIRECT, TRIG, CHOLHDL          GASTROENTEROLOGY:  no Change in bowel habits.      ENDOCRINOLOGY:  no history of Diabetes.           She has regular menstrual cycles, does have  symptoms of premenstrual syndrome  Recently told to start vitamin D supplements for deficiency  She has rarely had all with anxiety, and college years she took Xanax because of a panic attack No depression    Examination:    BP 138/90 mmHg  Pulse 105  Temp(Src) 97.7 F (36.5 C)  Resp 16  Ht 5\' 10"  (1.778 m)  Wt 163 lb 12.8 oz (74.299 kg)  BMI 23.50 kg/m2  SpO2 99%  GENERAL:  Average build.   No pallor, clubbing, lymphadenopathy or edema.   Skin:  no rash or pigmentation.  EYES:  No prominence of the eyes or swelling of the eyelids  ENT: Exam not indicated  THYROID: This is enlarged bilaterally and about 1-1/2-2 times normal, smooth, firm and felt mostly in the medial part of the thyroid lobes and around the isthmus.  No tenderness or distention nodules felt  HEART:  Normal  S1 and S2; no murmur or click.  CHEST:    Lungs: Vescicular breath sounds heard equally.  No crepitations/ wheeze.  ABDOMEN:  Exam not indicated.  No distention  NEUROLOGICAL: Reflexes are bilaterally normal at biceps and ankles.  No tremor.  JOINTS:  Normal. Extremities: Her hands and feet are cold to touch.   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 Although her TSH is significantly high around 10-11 she appears to be asymptomatic Her thyroid enlargement has the typical characteristics of Hashimoto's thyroiditis  She appears to be very sensitive to levothyroxine even with 25 g and appears to have symptoms suggestive of hyperthyroidism with taking the supplement and no change in her energy level  Treatment:    Explained the patient that she does have Hashimoto thyroiditis with mild hypothyroidism However since she is asymptomatic and is intolerant to even small doses of supplement discussed that she would not have any adverse effects on her health of leaving her mild hypothyroidism untreated for now She does agree  to regular follow-up to see if due to progression of her hypothyroidism since we don't have any previous baseline studies She also was given information on Hashimoto thyroiditis and  hypothyroidism and she will call us for a visit sooner if she starts having unusual fatigue or other symptoms Since  her clinical diagnosis is very suggestive of Hashimoto's thyroiditis do not think we need to do any antibody studies at this time as these would be purely academic  She will come back in 3 months for follow-up and repeat thyroid levels  Have also recommended that we check her lipid levels on the next visit  Shahzain Kiester 04/01/2015, 9:50 AM

## 2015-04-04 ENCOUNTER — Other Ambulatory Visit: Payer: Self-pay | Admitting: *Deleted

## 2015-04-04 DIAGNOSIS — R7989 Other specified abnormal findings of blood chemistry: Secondary | ICD-10-CM

## 2015-04-05 ENCOUNTER — Telehealth: Payer: Self-pay | Admitting: *Deleted

## 2015-04-05 NOTE — Telephone Encounter (Signed)
Rec a fax stating Pulmicort PA is denied. Alternative meds include Asmanex and Flovent. Please advise

## 2015-04-06 MED ORDER — MOMETASONE FUROATE 220 MCG/INH IN AEPB: 2.0000 | INHALATION_SPRAY | Freq: Every day | RESPIRATORY_TRACT | Status: DC

## 2015-04-06 NOTE — Telephone Encounter (Signed)
OK asmanex - done Thx

## 2015-04-06 NOTE — Telephone Encounter (Signed)
Pt informed

## 2015-04-21 ENCOUNTER — Encounter: Payer: Self-pay | Admitting: Internal Medicine

## 2015-05-03 ENCOUNTER — Ambulatory Visit: Payer: Self-pay | Admitting: Internal Medicine

## 2015-05-09 ENCOUNTER — Encounter: Payer: Self-pay | Admitting: Family

## 2015-05-09 ENCOUNTER — Ambulatory Visit (INDEPENDENT_AMBULATORY_CARE_PROVIDER_SITE_OTHER): Payer: BLUE CROSS/BLUE SHIELD | Admitting: Family

## 2015-05-09 VITALS — BP 132/88 | HR 97 | Temp 98.4°F | Resp 18 | Ht 70.0 in | Wt 167.8 lb

## 2015-05-09 DIAGNOSIS — J3489 Other specified disorders of nose and nasal sinuses: Secondary | ICD-10-CM

## 2015-05-09 MED ORDER — PREDNISONE 20 MG PO TABS: 20.0000 mg | ORAL_TABLET | Freq: Two times a day (BID) | ORAL | Status: DC

## 2015-05-09 MED ORDER — AMOXICILLIN 500 MG PO CAPS: 500.0000 mg | ORAL_CAPSULE | Freq: Two times a day (BID) | ORAL | Status: DC

## 2015-05-09 NOTE — Assessment & Plan Note (Signed)
Symptoms and exam consistent with sinusitis secondary to seasonal allergies. No evidence of otic infection. Start amoxicillin. Start prednisone. Continue over-the-counter medications as needed for symptom relief and supportive care. Follow-up if symptoms worsen or don't improve.

## 2015-05-09 NOTE — Progress Notes (Signed)
Pre visit review using our clinic review tool, if applicable. No additional management support is needed unless otherwise documented below in the visit note. 

## 2015-05-09 NOTE — Patient Instructions (Signed)
Thank you for choosing [facility].  Summary/Instructions:  Your prescription(s) have been submitted to your pharmacy or been printed and provided for you. Please take as directed and contact our office if you believe you are having problem(s) with the medication(s) or have any questions.  If your symptoms worsen or fail to improve, please contact our office for further instruction, or in case of emergency go directly to the emergency room at the closest medical facility.   Allergic Rhinitis Allergic rhinitis is when the mucous membranes in the nose respond to allergens. Allergens are particles in the air that cause your body to have an allergic reaction. This causes you to release allergic antibodies. Through a chain of events, these eventually cause you to release histamine into the blood stream. Although meant to protect the body, it is this release of histamine that causes your discomfort, such as frequent sneezing, congestion, and an itchy, runny nose.  CAUSES  Seasonal allergic rhinitis (hay fever) is caused by pollen allergens that may come from grasses, trees, and weeds. Year-round allergic rhinitis (perennial allergic rhinitis) is caused by allergens such as house dust mites, pet dander, and mold spores.  SYMPTOMS   Nasal stuffiness (congestion).  Itchy, runny nose with sneezing and tearing of the eyes. DIAGNOSIS  Your health care provider can help you determine the allergen or allergens that trigger your symptoms. If you and your health care provider are unable to determine the allergen, skin or blood testing may be used. TREATMENT  Allergic rhinitis does not have a cure, but it can be controlled by:  Medicines and allergy shots (immunotherapy).  Avoiding the allergen. Hay fever may often be treated with antihistamines in pill or nasal spray forms. Antihistamines block the effects of histamine. There are over-the-counter medicines that may help with nasal congestion and  swelling around the eyes. Check with your health care provider before taking or giving this medicine.  If avoiding the allergen or the medicine prescribed do not work, there are many new medicines your health care provider can prescribe. Stronger medicine may be used if initial measures are ineffective. Desensitizing injections can be used if medicine and avoidance does not work. Desensitization is when a patient is given ongoing shots until the body becomes less sensitive to the allergen. Make sure you follow up with your health care provider if problems continue. HOME CARE INSTRUCTIONS It is not possible to completely avoid allergens, but you can reduce your symptoms by taking steps to limit your exposure to them. It helps to know exactly what you are allergic to so that you can avoid your specific triggers. SEEK MEDICAL CARE IF:   You have a fever.  You develop a cough that does not stop easily (persistent).  You have shortness of breath.  You start wheezing.  Symptoms interfere with normal daily activities. Document Released: 06/19/2001 Document Revised: 09/29/2013 Document Reviewed: 06/01/2013 [facility] Patient Information 2015 [place], [place]. This information is not intended to replace advice given to you by your health care provider. Make sure you discuss any questions you have with your health care provider.

## 2015-05-09 NOTE — Progress Notes (Signed)
Subjective:    Patient ID: Amy Bautista, female    DOB: 10/11/1975, 39 y.o.   MRN: [id number]  Chief Complaint  Patient presents with  . Ear Pain    x2 weeks, has the ear popping feeling, now ear is aching and sinuses hurt, left side, alot of pressure, has been using decongestants and OTC allergy medication that haven't helped much    HPI:  Amy Bautista is a 39 y.o. female with a PMH of allergic rhinitis, asthma, hypothyroidism, and vitamin D deficiency who presents today for an acute office visit.  This is a new problem. Associated symptoms of ear popping, aching, and sinus pressure has been going on for approximately 2 weeks. Modifying factors include decongestants and over-the-counter allergy medication which have not helped much. Denies antibiotic use recently.    Allergies  Allergen Reactions  . Doxycycline     nausea  . Fish Allergy Itching and Nausea Only  . Shellfish Allergy     Current Outpatient Prescriptions on File Prior to Visit  Medication Sig Dispense Refill  . albuterol (PROAIR HFA) 108 (90 BASE) MCG/ACT inhaler Inhale 2 puffs into the lungs every 6 (six) hours as needed for wheezing or shortness of breath. 1 Inhaler 11  . ALPRAZolam (XANAX) 0.25 MG tablet Take 1 tablet (0.25 mg total) by mouth 2 (two) times daily. 30 tablet 1  . Cetirizine HCl (ZYRTEC PO) Take by mouth.      . cholecalciferol (VITAMIN D) 1000 UNITS tablet Take 2 tablets (2,000 Units total) by mouth daily. 100 tablet 3  . ergocalciferol (VITAMIN D2) 50000 UNITS capsule Take 1 capsule (50,000 Units total) by mouth once a week. 6 capsule 0  . fluticasone (FLONASE) 50 MCG/ACT nasal spray Place 1 spray into both nostrils daily.    . mometasone (ASMANEX 60 METERED DOSES) 220 MCG/INH inhaler Inhale 2 puffs into the lungs daily. 1 Inhaler 12  . Multiple Vitamin (MULTIVITAMIN) tablet Take 1 tablet by mouth daily.     No current facility-administered medications on file prior to visit.      Review of Systems  Constitutional: Negative for fever and chills.  HENT: Positive for congestion, ear pain and sinus pressure. Negative for dental problem and sore throat.   Respiratory: Negative for cough, chest tightness and shortness of breath.   Neurological: Positive for headaches.      Objective:    BP 132/88 mmHg  Pulse 97  Temp(Src) 98.4 F (36.9 C) (Oral)  Resp 18  Ht 5\' 10"  (1.778 m)  Wt 167 lb 12.8 oz (76.114 kg)  BMI 24.08 kg/m2  SpO2 99% Nursing note and vital signs reviewed.  Physical Exam  Constitutional: She is oriented to person, place, and time. She appears well-developed and well-nourished. No distress.  HENT:  Right Ear: Hearing, tympanic membrane, external ear and ear canal normal.  Left Ear: Hearing, tympanic membrane, external ear and ear canal normal.  Nose: Left sinus exhibits maxillary sinus tenderness.  Mouth/Throat: Uvula is midline, oropharynx is clear and moist and mucous membranes are normal.  Tubinates are pale and boggy appearing.   Cardiovascular: Normal rate, regular rhythm, normal heart sounds and intact distal pulses.   Pulmonary/Chest: Effort normal and breath sounds normal.  Neurological: She is alert and oriented to person, place, and time.  Skin: Skin is warm and dry.  Psychiatric: She has a normal mood and affect. Her behavior is normal. Judgment and thought content normal.  Assessment & Plan:   Problem List Items Addressed This Visit      Other   Sinus pressure - Primary    Symptoms and exam consistent with sinusitis secondary to seasonal allergies. No evidence of otic infection. Start amoxicillin. Start prednisone. Continue over-the-counter medications as needed for symptom relief and supportive care. Follow-up if symptoms worsen or don't improve.      Relevant Medications   predniSONE (DELTASONE) 20 MG tablet   amoxicillin (AMOXIL) 500 MG capsule

## 2015-05-13 ENCOUNTER — Encounter: Payer: Self-pay | Admitting: Endocrinology

## 2015-05-13 ENCOUNTER — Ambulatory Visit (INDEPENDENT_AMBULATORY_CARE_PROVIDER_SITE_OTHER): Payer: BLUE CROSS/BLUE SHIELD | Admitting: Endocrinology

## 2015-05-13 DIAGNOSIS — E038 Other specified hypothyroidism: Secondary | ICD-10-CM

## 2015-05-13 DIAGNOSIS — E063 Autoimmune thyroiditis: Secondary | ICD-10-CM | POA: Diagnosis not present

## 2015-05-13 DIAGNOSIS — E039 Hypothyroidism, unspecified: Secondary | ICD-10-CM

## 2015-05-13 LAB — LIPID PANEL
Cholesterol: 161 mg/dL (ref 0–200)
HDL: 70.2 mg/dL (ref >39.00)
LDL CALC: 77 mg/dL (ref 0–99)
NonHDL: 90.61
Total CHOL/HDL Ratio: 2
Triglycerides: 68 mg/dL (ref 0.0–149.0)
VLDL: 13.6 mg/dL (ref 0.0–40.0)

## 2015-05-13 LAB — T4, FREE: Free T4: 0.77 ng/dL (ref 0.60–1.60)

## 2015-05-13 LAB — TSH: TSH: 5.61 u[IU]/mL — ABNORMAL HIGH (ref 0.35–4.50)

## 2015-05-13 NOTE — Progress Notes (Signed)
Patient ID: Amy Bautista, female   DOB: 12/20/1975, 39 y.o.   MRN: [id number]            Reason for Appointment:  Neck discomfort   History of Present Illness:   Recent history: She says for the last week or 2 she has felt a tenderness in her left neck area in the low part. She is not having discomfort spontaneously and does not feel much in  later in the day she tends to feel a little tender in that area.  She thinks that this is mostly  When she is trying to feel her thyroid on her own She has been concerned about inflammation in her thyroid and is somewhat concerned about her diagnosis  She also has been having some sensation of ear popping, sinus discomfort and was treated with prednisone and antibiotics by her PCP for probable sinusitis but her neck tenderness is not better  She is still having any unusual fatigue as before and had subjectively gone back to feeling fairly good with resolution of her anxiety symptoms that occurred when she tried levothyroxine  Background history: She was initially in 6/16 seen for elevation of hypothyroidism which was asymptomatic and associated with TSH levels around 9-11 She had a screening TSH done and was 9.5           Her repeat TSH was 11.25 and she was recommended levothyroxine 25 g daily on 03/24/15 With starting this a few days later she started feeling very anxious, jittery and felt her heart beating fast as well as not being able to concentrate on her studying. She did not  feel  any change in her energy level with starting her levothyroxine supplement and this was stopped   Lab Results  Component Value Date   FREET4 0.75 03/25/2015   TSH 11.25* 03/25/2015   TSH 9.50* 03/23/2015     Past Medical History  Diagnosis Date  . Asthma   . Dysplastic nevi     excised lesions: Left UE, Right LE  . Allergy     Past Surgical History  Procedure Laterality Date  . Orthodontic    . G2p2    . Knee surgery      Arthroscopic     Family History  Problem Relation Age of Onset  . Cancer Father     melanoma  . Hypertension Brother   . Hypertension Paternal Grandfather   . Heart disease Paternal Grandfather   . Cancer Paternal Grandmother     LUNG   . Breast cancer Maternal Grandmother     70's  . Thyroid disease Neg Hx     Social History:  reports that she has never smoked. She has never used smokeless tobacco. She reports that she does not drink alcohol or use illicit drugs.  Allergies:  Allergies  Allergen Reactions  . Doxycycline     nausea  . Fish Allergy Itching and Nausea Only  . Shellfish Allergy       Medication List       This list is accurate as of: 05/13/15  3:02 PM.  Always use your most recent med list.               albuterol 108 (90 BASE) MCG/ACT inhaler  Commonly known as:  PROAIR HFA  Inhale 2 puffs into the lungs every 6 (six) hours as needed for wheezing or shortness of breath.     ALPRAZolam 0.25 MG tablet  Commonly known as:  XANAX  Take 1 tablet (0.25 mg total) by mouth 2 (two) times daily.     amoxicillin 500 MG capsule  Commonly known as:  AMOXIL  Take 1 capsule (500 mg total) by mouth 2 (two) times daily.     budesonide 180 MCG/ACT inhaler  Commonly known as:  PULMICORT  Inhale into the lungs 2 (two) times daily.     cholecalciferol 1000 UNITS tablet  Commonly known as:  VITAMIN D  Take 2 tablets (2,000 Units total) by mouth daily.     ergocalciferol 50000 UNITS capsule  Commonly known as:  VITAMIN D2  Take 1 capsule (50,000 Units total) by mouth once a week.     fluticasone 50 MCG/ACT nasal spray  Commonly known as:  FLONASE  Place 1 spray into both nostrils daily.     mometasone 220 MCG/INH inhaler  Commonly known as:  ASMANEX 60 METERED DOSES  Inhale 2 puffs into the lungs daily.     multivitamin tablet  Take 1 tablet by mouth daily.     predniSONE 20 MG tablet  Commonly known as:  DELTASONE  Take 1 tablet (20 mg total) by mouth 2 (two) times  daily with a meal.     ZYRTEC PO  Take by mouth.        Review of Systems:  Heart sinus discomfort and ear popping is somewhat better recently     Examination:    BP 132/74 mmHg  Pulse 85  Resp 16  Ht 5\' 10"  (1.778 m)  Wt 168 lb (76.204 kg)  BMI 24.11 kg/m2  SpO2 98%   THYROID: This is enlarged bilaterally.  Right-sided is about 1-1/2 times normal, smooth and firm. Left side is relatively larger in about twice normal, smooth and firm but no tenderness elicited No lymphadenopathy in the neck Carotid bulb is not tender    Assessment  Most likely she is having some tenderness in her left thyroid lobe with Hashimoto's thyroiditis and this appears to be slightly larger than on last visit   again she does not have any symptoms of hypothyroidism  Treatment:    Reassured her that her tenderness is not indicative of any underlying processes except Hashimoto's thyroiditis Will check her TSH again today Also will confirm autoimmunity with thyroid oxidase antibody level today   Amy Bautista 05/13/2015, 3:02 PM    Addendum: TSH nearly normal, peroxidase antibody positive confirming Hashimoto's thyroiditis  Recommend follow-up in 3 months for exam and if there is any significant change in exam will do thyroid ultrasound

## 2015-05-14 LAB — THYROID PEROXIDASE ANTIBODY: Thyroperoxidase Ab SerPl-aCnc: 364 IU/mL — ABNORMAL HIGH (ref 0–34)

## 2015-05-14 NOTE — Progress Notes (Signed)
Quick Note:  TSH nearly normal indicating resolving hypothyroidism, peroxidase antibody positive confirming Hashimoto's thyroiditis. No further evaluation needed now Recommend follow-up in 3 months for examining the thyroid and rechecking TSH ______

## 2015-05-25 ENCOUNTER — Encounter: Payer: Self-pay | Admitting: Gynecology

## 2015-06-07 ENCOUNTER — Other Ambulatory Visit: Payer: BLUE CROSS/BLUE SHIELD

## 2015-06-08 ENCOUNTER — Telehealth: Payer: Self-pay | Admitting: Internal Medicine

## 2015-06-08 NOTE — Telephone Encounter (Signed)
Received medical records from [facility], sent to Dr. Alain Marion. 06/08/15/ss

## 2015-06-10 ENCOUNTER — Ambulatory Visit: Payer: BLUE CROSS/BLUE SHIELD | Admitting: Endocrinology

## 2015-06-20 ENCOUNTER — Telehealth: Payer: Self-pay | Admitting: Internal Medicine

## 2015-06-20 NOTE — Telephone Encounter (Signed)
Rec'd from [facility] forward 3 pages to Dr. Plotnikov °

## 2015-06-22 ENCOUNTER — Telehealth: Payer: Self-pay | Admitting: Endocrinology

## 2015-06-22 ENCOUNTER — Other Ambulatory Visit (INDEPENDENT_AMBULATORY_CARE_PROVIDER_SITE_OTHER): Payer: BLUE CROSS/BLUE SHIELD

## 2015-06-22 DIAGNOSIS — E039 Hypothyroidism, unspecified: Secondary | ICD-10-CM

## 2015-06-22 DIAGNOSIS — R7989 Other specified abnormal findings of blood chemistry: Secondary | ICD-10-CM | POA: Diagnosis not present

## 2015-06-22 DIAGNOSIS — E038 Other specified hypothyroidism: Secondary | ICD-10-CM | POA: Diagnosis not present

## 2015-06-22 DIAGNOSIS — R899 Unspecified abnormal finding in specimens from other organs, systems and tissues: Secondary | ICD-10-CM

## 2015-06-22 LAB — TSH: TSH: 5.75 u[IU]/mL — AB (ref 0.35–4.50)

## 2015-06-22 LAB — T4, FREE: FREE T4: 0.89 ng/dL (ref 0.60–1.60)

## 2015-06-22 LAB — T3, FREE: T3 FREE: 2.9 pg/mL (ref 2.3–4.2)

## 2015-06-22 NOTE — Telephone Encounter (Signed)
Pt stating she would like to just come in for an appointment she is having a hard time sleeping and wants to discuss symptoms. Feels exhausted and then wired and anxious, she is having some issues please advise.

## 2015-06-22 NOTE — Telephone Encounter (Signed)
Please see below and advise.

## 2015-06-22 NOTE — Telephone Encounter (Signed)
Labs to be done today.

## 2015-06-24 ENCOUNTER — Encounter: Payer: Self-pay | Admitting: Endocrinology

## 2015-06-24 ENCOUNTER — Ambulatory Visit (INDEPENDENT_AMBULATORY_CARE_PROVIDER_SITE_OTHER): Payer: BLUE CROSS/BLUE SHIELD | Admitting: Endocrinology

## 2015-06-24 VITALS — BP 124/82 | HR 93 | Temp 98.7°F | Resp 14 | Ht 70.0 in | Wt 166.8 lb

## 2015-06-24 DIAGNOSIS — E063 Autoimmune thyroiditis: Secondary | ICD-10-CM | POA: Diagnosis not present

## 2015-06-24 NOTE — Progress Notes (Signed)
Patient ID: Amy Bautista, female   DOB: 06/15/1976, 39 y.o.   MRN: [id number]            Reason for Appointment:  Difficulty sleeping and anxiety   History of Present Illness:   Recent history: She says for the last few weeks she has had more anxiety and is unable to relax.  She feels stressed and also has some difficulty sleeping. She was concerned that this may be related to her thyroid She has been reading on the Internet about Hashimoto's thyroiditis and is concerned about various systemic effects  She is still having any unusual fatigue or cold intolerance and no recent weight change Her last exam she had enlargement of the left thyroid lobe which was firm and larger than before  Background history: She was initially in 6/16 seen for elevation of hypothyroidism which was asymptomatic and associated with TSH levels around 9-11 She had a screening TSH done and was 9.5           Her repeat TSH was 11.25 and she was recommended levothyroxine 25 g daily on 03/24/15 With starting this a few days later she started feeling very anxious, jittery and felt her heart beating fast as well as not being able to concentrate on her studying. She did not  feel  any change in her energy level with starting her levothyroxine supplement and this was stopped  Since 8/15 her thyroid levels have been unchanged with mild increase in TSH, free T4 appears slightly better   Lab Results  Component Value Date   FREET4 0.89 06/22/2015   FREET4 0.77 05/13/2015   FREET4 0.75 03/25/2015   TSH 5.75* 06/22/2015   TSH 5.61* 05/13/2015   TSH 11.25* 03/25/2015     Past Medical History  Diagnosis Date  . Asthma   . Dysplastic nevi     excised lesions: Left UE, Right LE  . Allergy     Past Surgical History  Procedure Laterality Date  . Orthodontic    . G2p2    . Knee surgery      Arthroscopic    Family History  Problem Relation Age of Onset  . Cancer Father     melanoma  . Hypertension  Brother   . Hypertension Paternal Grandfather   . Heart disease Paternal Grandfather   . Cancer Paternal Grandmother     LUNG   . Breast cancer Maternal Grandmother     70's  . Thyroid disease Neg Hx     Social History:  reports that she has never smoked. She has never used smokeless tobacco. She reports that she does not drink alcohol or use illicit drugs.  Allergies:  Allergies  Allergen Reactions  . Doxycycline     nausea  . Fish Allergy Itching and Nausea Only  . Shellfish Allergy       Medication List       This list is accurate as of: 06/24/15  3:49 PM.  Always use your most recent med list.               albuterol 108 (90 BASE) MCG/ACT inhaler  Commonly known as:  PROAIR HFA  Inhale 2 puffs into the lungs every 6 (six) hours as needed for wheezing or shortness of breath.     ALPRAZolam 0.25 MG tablet  Commonly known as:  XANAX  Take 1 tablet (0.25 mg total) by mouth 2 (two) times daily.     amoxicillin 500 MG capsule  Commonly known as:  AMOXIL  Take 1 capsule (500 mg total) by mouth 2 (two) times daily.     budesonide 180 MCG/ACT inhaler  Commonly known as:  PULMICORT  Inhale into the lungs 2 (two) times daily.     cholecalciferol 1000 UNITS tablet  Commonly known as:  VITAMIN D  Take 2 tablets (2,000 Units total) by mouth daily.     ergocalciferol 50000 UNITS capsule  Commonly known as:  VITAMIN D2  Take 1 capsule (50,000 Units total) by mouth once a week.     fluticasone 50 MCG/ACT nasal spray  Commonly known as:  FLONASE  Place 1 spray into both nostrils daily.     mometasone 220 MCG/INH inhaler  Commonly known as:  ASMANEX 60 METERED DOSES  Inhale 2 puffs into the lungs daily.     multivitamin tablet  Take 1 tablet by mouth daily.     predniSONE 20 MG tablet  Commonly known as:  DELTASONE  Take 1 tablet (20 mg total) by mouth 2 (two) times daily with a meal.     ZYRTEC PO  Take by mouth.        Review of Systems:  No recent  recurrence of her ear infection.  She did have some difficulties with insomnia and took Xanax with relief from an old prescription     Examination:    BP 124/82 mmHg  Pulse 93  Temp(Src) 98.7 F (37.1 C)  Resp 14  Ht 5\' 10"  (1.778 m)  Wt 166 lb 12.8 oz (75.66 kg)  BMI 23.93 kg/m2  SpO2 96%   THYROID:  Right-sided is just palpable, somewhat irregular and firm Left side is firm, smooth and nearly twice normal, prominent especially laterally, no tenderness No lymphadenopathy in the neck    Assessment   Hashimoto's thyroiditis with minimal increase in TSH, stable Less local discomfort and tenderness related to inflammatory changes Her left lobe enlargement appears to be slightly smaller than last month and right lobe is also relatively small now  Most likely she is having some tenderness in her left thyroid lobe with Hashimoto's thyroiditis and this appears to be slightly larger than on last visit  Recently she does not have any symptoms of hypothyroidism Reassured her that her symptoms of anxiety, ear problems, difficulty sleeping are not related to Hashimoto thyroiditis and this is a local and not a systemic disease  Treatment:    None, follow-up in 3 months with repeat thyroid levels  She was recommended follow-up with PCP for management of anxiety but she thinks she can manage on her own  [facility] 06/24/2015, 3:49 PM

## 2015-06-30 ENCOUNTER — Ambulatory Visit (INDEPENDENT_AMBULATORY_CARE_PROVIDER_SITE_OTHER): Payer: BLUE CROSS/BLUE SHIELD | Admitting: Psychology

## 2015-06-30 ENCOUNTER — Other Ambulatory Visit: Payer: BLUE CROSS/BLUE SHIELD

## 2015-06-30 DIAGNOSIS — F411 Generalized anxiety disorder: Secondary | ICD-10-CM | POA: Diagnosis not present

## 2015-07-05 ENCOUNTER — Ambulatory Visit: Payer: BLUE CROSS/BLUE SHIELD | Admitting: Endocrinology

## 2015-07-12 ENCOUNTER — Ambulatory Visit: Payer: BLUE CROSS/BLUE SHIELD | Admitting: Psychology

## 2015-07-18 ENCOUNTER — Ambulatory Visit (INDEPENDENT_AMBULATORY_CARE_PROVIDER_SITE_OTHER): Payer: BLUE CROSS/BLUE SHIELD | Admitting: Psychology

## 2015-07-18 DIAGNOSIS — F411 Generalized anxiety disorder: Secondary | ICD-10-CM

## 2015-07-20 ENCOUNTER — Encounter: Payer: Self-pay | Admitting: Gynecology

## 2015-07-27 ENCOUNTER — Ambulatory Visit (INDEPENDENT_AMBULATORY_CARE_PROVIDER_SITE_OTHER): Payer: BLUE CROSS/BLUE SHIELD | Admitting: Psychology

## 2015-07-27 DIAGNOSIS — F411 Generalized anxiety disorder: Secondary | ICD-10-CM

## 2015-08-02 ENCOUNTER — Telehealth: Payer: Self-pay | Admitting: Internal Medicine

## 2015-08-02 ENCOUNTER — Encounter: Payer: Self-pay | Admitting: Internal Medicine

## 2015-08-02 NOTE — Telephone Encounter (Signed)
States Dr. Lyndle Herrlich is needing a call from Dr. Alain Marion at  [phone].

## 2015-08-03 ENCOUNTER — Telehealth: Payer: Self-pay | Admitting: *Deleted

## 2015-08-03 ENCOUNTER — Ambulatory Visit: Payer: BLUE CROSS/BLUE SHIELD | Admitting: Psychology

## 2015-08-03 MED ORDER — SERTRALINE HCL 25 MG PO TABS: 25.0000 mg | ORAL_TABLET | Freq: Every day | ORAL | Status: DC

## 2015-08-03 MED ORDER — ALPRAZOLAM 0.25 MG PO TABS: 0.2500 mg | ORAL_TABLET | Freq: Two times a day (BID) | ORAL | Status: DC

## 2015-08-03 NOTE — Telephone Encounter (Signed)
Done- See meds.    Erline Levine, please renew Xanax    And email Zoloft 25 mg #30 with 5 ref - use as directed    Thx         From   [place], MD    Sent   08/02/2015 7:37 PM       I am not sure if this is helpful, but the plan of care Dr. Pablo Ledger and i talked about was something you and i had talked about too. Starting Zoloft at a low dose. 12.5 (cutting the 25 in half and working up from there is needed)  We also talked about him requesting refills for the low dose of alprazolam (.025). i have been taking it for sleep, and he wants me to also have it just incase the zoloft causes any extra anxiety while the levels stabilize.  The goal will be to get off the alprazolam in the evening.  I have been having increasing anxiety, and it is really bad when i am falling asleep, it is almost like a panic that happens when i am trying to get to sleep. I have been working on it a lot and it is getting better, but i also feel like i am doing all the right things and it is not going away, and that is why i am willing to try the zoloft  Anyways, that is the back story about why my psychologist Dr. Pablo Ledger is trying to contact you about medication.

## 2015-08-03 NOTE — Telephone Encounter (Signed)
Already addressed: Rx emailed for Zoloft Thx

## 2015-08-04 NOTE — Telephone Encounter (Signed)
Left patient vm asking if she needs anything else.

## 2015-08-10 ENCOUNTER — Telehealth: Payer: Self-pay

## 2015-08-10 ENCOUNTER — Ambulatory Visit (INDEPENDENT_AMBULATORY_CARE_PROVIDER_SITE_OTHER): Payer: BLUE CROSS/BLUE SHIELD | Admitting: Psychology

## 2015-08-10 DIAGNOSIS — F411 Generalized anxiety disorder: Secondary | ICD-10-CM | POA: Diagnosis not present

## 2015-08-10 NOTE — Telephone Encounter (Signed)
PA started via cover my meds.  

## 2015-08-11 ENCOUNTER — Other Ambulatory Visit: Payer: Self-pay | Admitting: Internal Medicine

## 2015-08-17 ENCOUNTER — Ambulatory Visit (INDEPENDENT_AMBULATORY_CARE_PROVIDER_SITE_OTHER): Payer: BLUE CROSS/BLUE SHIELD | Admitting: Psychology

## 2015-08-17 DIAGNOSIS — F411 Generalized anxiety disorder: Secondary | ICD-10-CM

## 2015-08-18 ENCOUNTER — Other Ambulatory Visit: Payer: BLUE CROSS/BLUE SHIELD

## 2015-08-22 ENCOUNTER — Ambulatory Visit: Payer: BLUE CROSS/BLUE SHIELD | Admitting: Endocrinology

## 2015-08-24 ENCOUNTER — Ambulatory Visit (INDEPENDENT_AMBULATORY_CARE_PROVIDER_SITE_OTHER): Payer: BLUE CROSS/BLUE SHIELD | Admitting: Psychology

## 2015-08-24 DIAGNOSIS — F411 Generalized anxiety disorder: Secondary | ICD-10-CM | POA: Diagnosis not present

## 2015-08-31 ENCOUNTER — Ambulatory Visit (INDEPENDENT_AMBULATORY_CARE_PROVIDER_SITE_OTHER): Payer: BLUE CROSS/BLUE SHIELD | Admitting: Psychology

## 2015-08-31 DIAGNOSIS — F411 Generalized anxiety disorder: Secondary | ICD-10-CM | POA: Diagnosis not present

## 2015-09-12 ENCOUNTER — Ambulatory Visit: Payer: BLUE CROSS/BLUE SHIELD | Admitting: Psychology

## 2015-09-16 ENCOUNTER — Other Ambulatory Visit (INDEPENDENT_AMBULATORY_CARE_PROVIDER_SITE_OTHER): Payer: BLUE CROSS/BLUE SHIELD

## 2015-09-16 DIAGNOSIS — E063 Autoimmune thyroiditis: Secondary | ICD-10-CM

## 2015-09-16 LAB — T4, FREE: FREE T4: 0.67 ng/dL (ref 0.60–1.60)

## 2015-09-16 LAB — TSH: TSH: 6.62 u[IU]/mL — AB (ref 0.35–4.50)

## 2015-09-19 ENCOUNTER — Ambulatory Visit (INDEPENDENT_AMBULATORY_CARE_PROVIDER_SITE_OTHER): Payer: BLUE CROSS/BLUE SHIELD | Admitting: Psychology

## 2015-09-19 DIAGNOSIS — F411 Generalized anxiety disorder: Secondary | ICD-10-CM | POA: Diagnosis not present

## 2015-09-21 ENCOUNTER — Encounter: Payer: Self-pay | Admitting: Endocrinology

## 2015-09-21 ENCOUNTER — Ambulatory Visit (INDEPENDENT_AMBULATORY_CARE_PROVIDER_SITE_OTHER): Payer: BLUE CROSS/BLUE SHIELD | Admitting: Endocrinology

## 2015-09-21 VITALS — BP 128/82 | HR 85 | Resp 14 | Ht 70.0 in | Wt 162.2 lb

## 2015-09-21 DIAGNOSIS — E063 Autoimmune thyroiditis: Secondary | ICD-10-CM | POA: Diagnosis not present

## 2015-09-21 NOTE — Patient Instructions (Signed)
Selenium 200mg daily 

## 2015-09-21 NOTE — Progress Notes (Signed)
Patient ID: Amy Bautista, female   DOB: 17-Jan-1976, 39 y.o.   MRN: [id number]            Reason for Appointment:  Follow-up of thyroid   History of Present Illness:   Recent history:   She is still not having any unusual fatigue or cold intolerance and no recent weight change She thinks she does have occasional aching feeling in her thyroid area and mild tenderness but this is gradually improving.  No difficulty swallowing although she may feel different while swallowing No choking but occasionally at night she will wake up feeling like she cannot breathe as well She has had a goiter persistently especially on the left side recently Her TSH level is still mildly increased in the similar range although free T4 is lower  Background history: She was initially in 6/16 seen for elevation of hypothyroidism which was asymptomatic and associated with TSH levels around 9-11 She had a screening TSH done and was 9.5           Her repeat TSH was 11.25 and she was recommended levothyroxine 25 g daily on 03/24/15 With starting this a few days later she started feeling very anxious, jittery and felt her heart beating fast as well as not being able to concentrate on her studying. She did not  feel  any change in her energy level with starting her levothyroxine supplement and this was stopped   Lab Results  Component Value Date   TSH 6.62* 09/16/2015   TSH 5.75* 06/22/2015   TSH 5.61* 05/13/2015   FREET4 0.67 09/16/2015   FREET4 0.89 06/22/2015   FREET4 0.77 05/13/2015      Past Medical History  Diagnosis Date  . Asthma   . Dysplastic nevi     excised lesions: Left UE, Right LE  . Allergy     Past Surgical History  Procedure Laterality Date  . Orthodontic    . G2p2    . Knee surgery      Arthroscopic    Family History  Problem Relation Age of Onset  . Cancer Father     melanoma  . Hypertension Brother   . Hypertension Paternal Grandfather   . Heart disease Paternal  Grandfather   . Cancer Paternal Grandmother     LUNG   . Breast cancer Maternal Grandmother     70's  . Thyroid disease Neg Hx     Social History:  reports that she has never smoked. She has never used smokeless tobacco. She reports that she does not drink alcohol or use illicit drugs.  Allergies:  Allergies  Allergen Reactions  . Doxycycline     nausea  . Fish Allergy Itching and Nausea Only  . Shellfish Allergy   . Zoloft [Sertraline Hcl]     More anxious      Medication List       This list is accurate as of: 09/21/15  4:28 PM.  Always use your most recent med list.               albuterol 108 (90 BASE) MCG/ACT inhaler  Commonly known as:  PROAIR HFA  Inhale 2 puffs into the lungs every 6 (six) hours as needed for wheezing or shortness of breath.     ALPRAZolam 0.25 MG tablet  Commonly known as:  XANAX  Take 1 tablet (0.25 mg total) by mouth 2 (two) times daily.     budesonide 180 MCG/ACT inhaler  Commonly known  as:  PULMICORT  Inhale into the lungs 2 (two) times daily.     cholecalciferol 1000 UNITS tablet  Commonly known as:  VITAMIN D  Take 2 tablets (2,000 Units total) by mouth daily.     EPIPEN 2-PAK 0.3 mg/0.3 mL Soaj injection  Generic drug:  EPINEPHrine  inject as directed if needed for SEVERE ALLERGIC REACTION     ergocalciferol 50000 UNITS capsule  Commonly known as:  VITAMIN D2  Take 1 capsule (50,000 Units total) by mouth once a week.     escitalopram 10 MG tablet  Commonly known as:  LEXAPRO  Take 5 mg by mouth every morning.     fluticasone 50 MCG/ACT nasal spray  Commonly known as:  FLONASE  Place 1 spray into both nostrils daily.     mometasone 220 MCG/INH inhaler  Commonly known as:  ASMANEX 60 METERED DOSES  Inhale 2 puffs into the lungs daily.     multivitamin tablet  Take 1 tablet by mouth daily.     ZYRTEC PO  Take by mouth.        Review of Systems:  She is now taking Lexapro from psychiatrist    Examination:      BP 128/82 mmHg  Pulse 85  Resp 14  Ht 5\' 10"  (1.778 m)  Wt 162 lb 3.2 oz (73.573 kg)  BMI 23.27 kg/m2  SpO2 98%   THYROID:  Right-sided is just palpable, somewhat irregular and firm Left side is firm, smooth and twice normal, prominent especially laterally, no tenderness No lymphadenopathy in the neck   Assessment   Hashimoto's thyroiditis with minimal increase in TSH, stable Has less local discomfort and tenderness related to inflammatory changes Her left lobe enlargement is stable, firm and diffuse  Again discussed that since she is subjectively doing well and she had intolerance to thyroxine supplementation will continue to monitor her periodically  Treatment:    None, follow-up in 6 months with repeat thyroid levels She may empirically try selenium for 3 months to see if it will help her thyroid swelling and tenderness, discussed that this is of proven  benefit only in one small study    [facility] 09/21/2015, 4:28 PM

## 2015-09-27 ENCOUNTER — Other Ambulatory Visit: Payer: Self-pay | Admitting: Internal Medicine

## 2015-09-28 ENCOUNTER — Encounter: Payer: Self-pay | Admitting: Gynecology

## 2015-09-29 NOTE — Telephone Encounter (Signed)
Done

## 2015-10-01 ENCOUNTER — Encounter: Payer: Self-pay | Admitting: Endocrinology

## 2015-10-04 ENCOUNTER — Ambulatory Visit: Payer: BLUE CROSS/BLUE SHIELD | Admitting: Psychology

## 2015-10-04 ENCOUNTER — Encounter: Payer: Self-pay | Admitting: *Deleted

## 2015-10-04 ENCOUNTER — Telehealth: Payer: Self-pay | Admitting: *Deleted

## 2015-10-04 NOTE — Telephone Encounter (Signed)
She can try taking half of the 25 g levothyroxine.  Let her know that this is likely to be helping her symptoms however

## 2015-10-04 NOTE — Telephone Encounter (Signed)
Please see patient email below.   Hi Dr. Dwyane Dee, I have finally gotten to the bottom of what is going on with my sleep, and i was wondering if you think it may be related to the hashimotos. I was feeling like was was having a hard time breathing and that is what was waking me up but i realized it is actually swallowing that is waking my up and also making it hard for me to fall asleep, i also seem to feel the need to swallow a lot during the day, like i have a full feeling in my throat. This is all new, within the last few months. Could it be related, and if so would starting synthroid help with this by possibly reducing the thyroid and therefore the full feeling in my throat? If so would it be possible to start with the tiniest dose possible and work my way up to the right dose?

## 2015-10-04 NOTE — Telephone Encounter (Signed)
Patient received your message, she has not been taking the synthroid at all.  She wants to know if you think if she does take the medication would it help with her swallowing problems?  She said this has been waking her up at night.  Please advise.

## 2015-10-04 NOTE — Telephone Encounter (Signed)
Noted, patient is aware. 

## 2015-10-05 ENCOUNTER — Other Ambulatory Visit: Payer: Self-pay | Admitting: *Deleted

## 2015-10-05 MED ORDER — LEVOTHYROXINE SODIUM 25 MCG PO TABS: ORAL_TABLET | ORAL | Status: DC

## 2015-10-12 ENCOUNTER — Ambulatory Visit (INDEPENDENT_AMBULATORY_CARE_PROVIDER_SITE_OTHER): Payer: BLUE CROSS/BLUE SHIELD | Admitting: Psychology

## 2015-10-12 DIAGNOSIS — F411 Generalized anxiety disorder: Secondary | ICD-10-CM

## 2015-10-13 ENCOUNTER — Other Ambulatory Visit: Payer: Self-pay | Admitting: Endocrinology

## 2015-10-13 ENCOUNTER — Telehealth: Payer: Self-pay | Admitting: *Deleted

## 2015-10-13 ENCOUNTER — Encounter: Payer: Self-pay | Admitting: Endocrinology

## 2015-10-13 DIAGNOSIS — E063 Autoimmune thyroiditis: Secondary | ICD-10-CM

## 2015-10-13 NOTE — Telephone Encounter (Signed)
It is not worth taking less than 12.5 g. But also would like to try naproxen since there is no known interaction with antidepressants

## 2015-10-13 NOTE — Telephone Encounter (Signed)
Email reply has been sent, thyroid ultrasound ordered

## 2015-10-13 NOTE — Telephone Encounter (Signed)
Please see patients email below.  Hi Dr. Dwyane Dee Suanne Marker)    I just got back into town so i haven't picked up the prescription yet. I have a question. Given how sensitive i was to the synthroid last time i tried it and the fact that i am so sensitive to medication in general i was wondering if it would be possible to start with even less than 1/2 of the 25 mg. Dr. Dwyane Dee and i had discussed this in the past: starting with a tiny dose and working my way up. I know it is impossible to accurately cut a pill into quarters, but would it be possible to have the people over at [facility] compound it?

## 2015-10-13 NOTE — Telephone Encounter (Signed)
Noted,  I emailed the patient back to let her know.

## 2015-10-13 NOTE — Telephone Encounter (Signed)
Okay, that is strange because it says right in the warnings taking NSAIDS could increase the likelihood for upper Gi bleeding etc    I am aware that taking less than 12.5 will not be effective but i am wondering if it may help my system to get use to the medication mare slowly and build up?

## 2015-10-14 ENCOUNTER — Other Ambulatory Visit: Payer: Self-pay | Admitting: Endocrinology

## 2015-10-14 ENCOUNTER — Ambulatory Visit
Admission: RE | Admit: 2015-10-14 | Discharge: 2015-10-14 | Disposition: A | Discharge status: Home / Self Care | Payer: BLUE CROSS/BLUE SHIELD | Source: Ambulatory Visit | Attending: Endocrinology | Admitting: Endocrinology

## 2015-10-14 DIAGNOSIS — E063 Autoimmune thyroiditis: Secondary | ICD-10-CM

## 2015-10-14 DIAGNOSIS — E041 Nontoxic single thyroid nodule: Secondary | ICD-10-CM

## 2015-10-19 ENCOUNTER — Other Ambulatory Visit (HOSPITAL_COMMUNITY)
Admission: RE | Admit: 2015-10-19 | Discharge: 2015-10-19 | Disposition: A | Discharge status: Home / Self Care | Payer: BLUE CROSS/BLUE SHIELD | Source: Ambulatory Visit | Attending: Radiology | Admitting: Radiology

## 2015-10-19 ENCOUNTER — Ambulatory Visit
Admission: RE | Admit: 2015-10-19 | Discharge: 2015-10-19 | Disposition: A | Discharge status: Home / Self Care | Payer: BLUE CROSS/BLUE SHIELD | Source: Ambulatory Visit | Attending: Endocrinology | Admitting: Endocrinology

## 2015-10-19 DIAGNOSIS — E041 Nontoxic single thyroid nodule: Secondary | ICD-10-CM | POA: Diagnosis not present

## 2015-10-21 ENCOUNTER — Telehealth: Payer: Self-pay | Admitting: Endocrinology

## 2015-10-21 NOTE — Telephone Encounter (Signed)
Patient called stating that she had her thyroid biopsy yesterday and she received her results on MyChart and would like to know if Dr. Dwyane Dee reviewed the results She would like to speak with Dr. Dwyane Dee    Thank you

## 2015-10-21 NOTE — Telephone Encounter (Signed)
Called and discussed

## 2015-10-21 NOTE — Telephone Encounter (Signed)
See note below and please advise, Thanks! 

## 2015-10-26 ENCOUNTER — Other Ambulatory Visit: Payer: Self-pay

## 2015-10-26 DIAGNOSIS — Z1231 Encounter for screening mammogram for malignant neoplasm of breast: Secondary | ICD-10-CM

## 2015-10-31 ENCOUNTER — Encounter (HOSPITAL_COMMUNITY): Payer: Self-pay

## 2015-10-31 ENCOUNTER — Encounter: Payer: Self-pay | Admitting: Endocrinology

## 2015-11-01 ENCOUNTER — Other Ambulatory Visit: Payer: Self-pay | Admitting: Endocrinology

## 2015-11-01 ENCOUNTER — Encounter: Payer: Self-pay | Admitting: Endocrinology

## 2015-11-01 DIAGNOSIS — C73 Malignant neoplasm of thyroid gland: Secondary | ICD-10-CM

## 2015-11-11 ENCOUNTER — Ambulatory Visit: Payer: Self-pay | Admitting: Surgery

## 2015-11-14 ENCOUNTER — Ambulatory Visit (INDEPENDENT_AMBULATORY_CARE_PROVIDER_SITE_OTHER): Payer: BLUE CROSS/BLUE SHIELD | Admitting: Adult Health

## 2015-11-14 ENCOUNTER — Encounter: Payer: Self-pay | Admitting: Adult Health

## 2015-11-14 VITALS — BP 120/70 | Temp 99.2°F | Ht 70.0 in | Wt 164.0 lb

## 2015-11-14 DIAGNOSIS — H6592 Unspecified nonsuppurative otitis media, left ear: Secondary | ICD-10-CM | POA: Diagnosis not present

## 2015-11-14 MED ORDER — AMOXICILLIN-POT CLAVULANATE 875-125 MG PO TABS: 1.0000 | ORAL_TABLET | Freq: Two times a day (BID) | ORAL | Status: DC

## 2015-11-14 NOTE — Progress Notes (Signed)
Subjective:    Patient ID: Amy Bautista, female    DOB: 06/16/1976, 40 y.o.   MRN: [id number]  HPI  Left ear pain for one week.She feels as though it is congested " like when you are in a plane and need to clear your ears." [facility] feels as though they have not been clearing as easily. 2-3 days ago she developed pain in that ear.   Denies any drainage.   Has low grade fever in the office today.   She also reports that she is having a thyroidectomy in 2 weeks and wants to be healthy for that.   Review of Systems  Constitutional: Negative.   HENT: Positive for ear pain. Negative for ear discharge, facial swelling, postnasal drip, rhinorrhea, sinus pressure and sore throat.    Past Medical History  Diagnosis Date  . Asthma   . Dysplastic nevi     excised lesions: Left UE, Right LE  . Allergy     Social History   Social History  . Marital Status: Married    Spouse Name: N/A  . Number of Children: 2  . Years of Education: 12+   Occupational History  . yog instructor    Social History Main Topics  . Smoking status: Never Smoker   . Smokeless tobacco: Never Used  . Alcohol Use: No  . Drug Use: No     Comment: former THC  . Sexual Activity:    Partners: Male    Birth Control/ Protection: Condom   Other Topics Concern  . 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 2 dtrs. Trained Sport and exercise psychologist. No h/o abuse.    Past Surgical History  Procedure Laterality Date  . Orthodontic    . G2p2    . Knee surgery      Arthroscopic    Family History  Problem Relation Age of Onset  . Cancer Father     melanoma  . Hypertension Brother   . Hypertension Paternal Grandfather   . Heart disease Paternal Grandfather   . Cancer Paternal Grandmother     LUNG   . Breast cancer Maternal Grandmother     70's  . Thyroid disease Neg Hx     Allergies  Allergen Reactions  . Doxycycline     nausea    . Fish Allergy Itching and Nausea Only  . Shellfish Allergy   . Zoloft [Sertraline Hcl]     More anxious    Current Outpatient Prescriptions on File Prior to Visit  Medication Sig Dispense Refill  . albuterol (PROAIR HFA) 108 (90 BASE) MCG/ACT inhaler Inhale 2 puffs into the lungs every 6 (six) hours as needed for wheezing or shortness of breath. 1 Inhaler 11  . ALPRAZolam (XANAX) 0.25 MG tablet take 1 tablet by mouth twice a day 30 tablet 2  . budesonide (PULMICORT) 180 MCG/ACT inhaler Inhale into the lungs 2 (two) times daily.    . Cetirizine HCl (ZYRTEC PO) Take by mouth.      . cholecalciferol (VITAMIN D) 1000 UNITS tablet Take 2 tablets (2,000 Units total) by mouth daily. 100 tablet 3  . EPIPEN 2-PAK 0.3 MG/0.3ML SOAJ injection inject as directed if needed for SEVERE ALLERGIC REACTION  0  . ergocalciferol (VITAMIN D2) 50000 UNITS capsule Take 1 capsule (50,000 Units total) by mouth once a week. 6 capsule 0  . escitalopram (LEXAPRO) 10 MG tablet Take 5 mg  by mouth every morning.  0  . fluticasone (FLONASE) 50 MCG/ACT nasal spray Place 1 spray into both nostrils daily.    . Multiple Vitamin (MULTIVITAMIN) tablet Take 1 tablet by mouth daily.    Marland Kitchen levothyroxine (LEVOTHROID) 25 MCG tablet Take 1/2 tablet daily (Patient not taking: Reported on 11/14/2015) 15 tablet 2   No current facility-administered medications on file prior to visit.    BP 120/70 mmHg  Temp(Src) 99.2 F (37.3 C) (Oral)  Ht 5\' 10"  (1.778 m)  Wt 164 lb (74.39 kg)  BMI 23.53 kg/m2       Objective:   Physical Exam  Constitutional: She is oriented to person, place, and time. She appears well-developed and well-nourished. No distress.  HENT:  Head: Normocephalic and atraumatic.  Right Ear: External ear normal.  Left Ear: External ear normal.  Nose: Nose normal.  Mouth/Throat: Oropharynx is clear and moist. No oropharyngeal exudate.  Otitis media with effusion in left ear.   Neurological: She is alert and  oriented to person, place, and time.  Skin: Skin is warm and dry. No rash noted. She is not diaphoretic. No erythema. No pallor.  Psychiatric: She has a normal mood and affect. Her behavior is normal. Judgment and thought content normal.  Nursing note and vitals reviewed.     Assessment & Plan:  1. Otitis media with effusion, left - amoxicillin-clavulanate (AUGMENTIN) 875-125 MG tablet; Take 1 tablet by mouth 2 (two) times daily.  Dispense: 14 tablet; Refill: 0 - Follow up if no improvement in the next 2-3 days or sooner if symptoms get worse

## 2015-11-16 ENCOUNTER — Encounter: Payer: Self-pay | Admitting: Gynecology

## 2015-11-22 NOTE — Pre-Procedure Instructions (Addendum)
Amy Bautista  11/22/2015      [facility], [place] [address] Phone: [phone] Fax: [phone]    Your procedure is scheduled on 12/01/15  Report to [facility] Admitting at 1100 A.M.  Call this number if you have problems the morning of surgery:  [phone]   Remember:  Do not eat food or drink liquids after midnight.  Take these medicines the morning of surgery with A SIP OF WATER inhalers if needed(bring albuterol inhaler),xanax if needed,lexapro   STOP all herbel meds, nsaids (aleve,naproxen,advil,ibuprofen)starting 11/26/15 including aspirin, all vitamins,melatonin   Do not wear jewelry, make-up or nail polish.  Do not wear lotions, powders, or perfumes.  You may wear deodorant.  Do not shave 48 hours prior to surgery.  Men may shave face and neck.  Do not bring valuables to the hospital.  [facility] is not responsible for any belongings or valuables.  Contacts, dentures or bridgework may not be worn into surgery.  Leave your suitcase in the car.  After surgery it may be brought to your room.  For patients admitted to the hospital, discharge time will be determined by your treatment team.  Patients discharged the day of surgery will not be allowed to drive home.   Name and phone number of your driver:    Special instructions:   Special Instructions: [place] - Preparing for Surgery  Before surgery, you can play an important role.  Because skin is not sterile, your skin needs to be as free of germs as possible.  You can reduce the number of germs on you skin by washing with CHG (chlorahexidine gluconate) soap before surgery.  CHG is an antiseptic cleaner which kills germs and bonds with the skin to continue killing germs even after washing.  Please DO NOT use if you have an allergy to CHG or antibacterial soaps.  If your skin becomes reddened/irritated  stop using the CHG and inform your nurse when you arrive at Short Stay.  Do not shave (including legs and underarms) for at least 48 hours prior to the first CHG shower.  You may shave your face.  Please follow these instructions carefully:   1.  Shower with CHG Soap the night before surgery and the morning of Surgery.  2.  If you choose to wash your hair, wash your hair first as usual with your normal shampoo.  3.  After you shampoo, rinse your hair and body thoroughly to remove the Shampoo.  4.  Use CHG as you would any other liquid soap.  You can apply chg directly  to the skin and wash gently with scrungie or a clean washcloth.  5.  Apply the CHG Soap to your body ONLY FROM THE NECK DOWN.  Do not use on open wounds or open sores.  Avoid contact with your eyes ears, mouth and genitals (private parts).  Wash genitals (private parts)       with your normal soap.  6.  Wash thoroughly, paying special attention to the area where your surgery will be performed.  7.  Thoroughly rinse your body with warm water from the neck down.  8.  DO NOT shower/wash with your normal soap after using and rinsing off the CHG Soap.  9.  Pat yourself dry with a clean towel.            10.  Wear clean pajamas.  11.  Place clean sheets on your bed the night of your first shower and do not sleep with pets.  Day of Surgery  Do not apply any lotions/deodorants the morning of surgery.  Please wear clean clothes to the hospital/surgery center.  Please read over the following fact sheets that you were given. Pain Booklet, Coughing and Deep Breathing and Surgical Site Infection Prevention

## 2015-11-23 ENCOUNTER — Encounter (HOSPITAL_COMMUNITY)
Admission: RE | Admit: 2015-11-23 | Discharge: 2015-11-23 | Disposition: A | Discharge status: Home / Self Care | Payer: BLUE CROSS/BLUE SHIELD | Source: Ambulatory Visit | Attending: Surgery | Admitting: Surgery

## 2015-11-23 ENCOUNTER — Encounter (HOSPITAL_COMMUNITY): Payer: Self-pay

## 2015-11-23 ENCOUNTER — Ambulatory Visit (HOSPITAL_COMMUNITY)
Admission: RE | Admit: 2015-11-23 | Discharge: 2015-11-23 | Disposition: A | Discharge status: Home / Self Care | Payer: BLUE CROSS/BLUE SHIELD | Source: Ambulatory Visit | Attending: Anesthesiology | Admitting: Anesthesiology

## 2015-11-23 DIAGNOSIS — E041 Nontoxic single thyroid nodule: Secondary | ICD-10-CM | POA: Diagnosis present

## 2015-11-23 DIAGNOSIS — F419 Anxiety disorder, unspecified: Secondary | ICD-10-CM | POA: Diagnosis not present

## 2015-11-23 DIAGNOSIS — Z79899 Other long term (current) drug therapy: Secondary | ICD-10-CM | POA: Diagnosis not present

## 2015-11-23 DIAGNOSIS — Z8349 Family history of other endocrine, nutritional and metabolic diseases: Secondary | ICD-10-CM | POA: Diagnosis not present

## 2015-11-23 DIAGNOSIS — Z01812 Encounter for preprocedural laboratory examination: Secondary | ICD-10-CM | POA: Insufficient documentation

## 2015-11-23 DIAGNOSIS — J45909 Unspecified asthma, uncomplicated: Secondary | ICD-10-CM | POA: Diagnosis not present

## 2015-11-23 DIAGNOSIS — D440 Neoplasm of uncertain behavior of thyroid gland: Secondary | ICD-10-CM | POA: Diagnosis not present

## 2015-11-23 DIAGNOSIS — Z01818 Encounter for other preprocedural examination: Secondary | ICD-10-CM

## 2015-11-23 DIAGNOSIS — C73 Malignant neoplasm of thyroid gland: Secondary | ICD-10-CM | POA: Diagnosis not present

## 2015-11-23 DIAGNOSIS — Z7951 Long term (current) use of inhaled steroids: Secondary | ICD-10-CM | POA: Diagnosis not present

## 2015-11-23 DIAGNOSIS — E063 Autoimmune thyroiditis: Secondary | ICD-10-CM | POA: Diagnosis not present

## 2015-11-23 HISTORY — DX: Malignant (primary) neoplasm, unspecified: C80.1

## 2015-11-23 HISTORY — DX: Pneumonia, unspecified organism: J18.9

## 2015-11-23 HISTORY — DX: Anxiety disorder, unspecified: F41.9

## 2015-11-23 HISTORY — DX: Disorder of thyroid, unspecified: E07.9

## 2015-11-23 LAB — HCG, SERUM, QUALITATIVE: Preg, Serum: NEGATIVE

## 2015-11-23 LAB — CBC
HEMATOCRIT: 38.2 % (ref 36.0–46.0)
HEMOGLOBIN: 12.2 g/dL (ref 12.0–15.0)
MCH: 29.6 pg (ref 26.0–34.0)
MCHC: 31.9 g/dL (ref 30.0–36.0)
MCV: 92.7 fL (ref 78.0–100.0)
Platelets: 231 10*3/uL (ref 150–400)
RBC: 4.12 MIL/uL (ref 3.87–5.11)
RDW: 13.2 % (ref 11.5–15.5)
WBC: 5.3 10*3/uL (ref 4.0–10.5)

## 2015-11-27 ENCOUNTER — Encounter (HOSPITAL_COMMUNITY): Payer: Self-pay | Admitting: Surgery

## 2015-11-27 DIAGNOSIS — D440 Neoplasm of uncertain behavior of thyroid gland: Secondary | ICD-10-CM | POA: Diagnosis present

## 2015-11-27 NOTE — H&P (Signed)
General Surgery [facility]  Amy Bautista DOB: 1976/01/24 Married / Language: Amy Bautista / Race: White Female  History of Present Illness  Patient words: thyroid. The patient is a 39 year old female who presents with a thyroid nodule. Patient is referred by Dr. Legrand Como Altheimer and Dr. Elayne Snare for evaluation of thyroid neoplasm of uncertain behavior suspicious for malignancy. Patient's primary care physician is Dr. Cristie Hem Plotnikov. Patient was diagnosed with hypothyroidism due to Hashimoto's thyroiditis and summer of 2016. Patient was started on Synthroid but had some untoward reactions, mainly with anxiety. She also developed fullness and discomfort in the left neck. Patient underwent thyroid ultrasound on October 14, 2015. This shows a normal right thyroid lobe. Left thyroid lobe measured 4.2 cm and a complex nodule measuring 1.7 cm was noted containing microcalcifications. Ultrasound-guided fine-needle aspiration biopsy was performed on October 19, 2015. This showed cytologic atypia of undetermined significance, Bethesda category III. specimen was sent for [facility] testing. This returned as suspicious based on their gene expression panel, placing her risk of malignancy at approximately 40%. However, the test for BRAF was positive, making her risk of malignancy 99%. Patient presents today to discuss surgery for thyroidectomy. Patient has had no prior head or neck surgery. There is a family history of benign thyroid nodule in a paternal grandmother. There is no family history of other endocrine neoplasms. Patient denies tremors or palpitations. She did have minor compressive symptoms prior to the aspiration when a fair volume of fluid was removed from the left thyroid nodule and symptoms improved.  Other Problems Anxiety Disorder Asthma Thyroid Cancer Thyroid Disease  Past Surgical History Knee Surgery Right. Oral Surgery  Diagnostic Studies  History Colonoscopy never Mammogram 1-3 years ago Pap Smear 1-5 years ago  Allergies Shellfish  Medication History ALPRAZolam (0.25MG Tablet, Oral) Active. EpiPen 2-Pak (0.3MG/0.3ML Soln Auto-inj, Injection) Active. Escitalopram Oxalate (10MG Tablet, Oral) Active. Qvar (80MCG/ACT Aerosol Soln, Inhalation) Active. Synthroid (25MCG Tablet, Oral) Active. ZyrTEC Allergy (10MG Capsule, Oral) Active. Multivitamin (Oral) Active. Ventolin HFA (108 (90 Base)MCG/ACT Aerosol Soln, Inhalation) Active. Vitamin D2 (2000UNIT Tablet, Oral) Active. Medications Reconciled  Social History No alcohol use No caffeine use No drug use Tobacco use Never smoker.  Family History Alcohol Abuse Mother. Depression Brother, Mother. Heart Disease Family Members In General. Hypertension Brother, Mother. Melanoma Father. Thyroid problems Family Members In General.  Pregnancy / Birth History Age at menarche 20 years. Gravida 2 Maternal age 72-35 Para 2 Regular periods  Review of Systems General Present- Night Sweats. Not Present- Appetite Loss, Chills, Fatigue, Fever, Weight Gain and Weight Loss. Skin Not Present- Change in Wart/Mole, Dryness, Hives, Jaundice, New Lesions, Non-Healing Wounds, Rash and Ulcer. HEENT Present- Hoarseness and Seasonal Allergies. Not Present- Earache, Hearing Loss, Nose Bleed, Oral Ulcers, Ringing in the Ears, Sinus Pain, Sore Throat, Visual Disturbances, Wears glasses/contact lenses and Yellow Eyes. Respiratory Present- Snoring. Not Present- Bloody sputum, Chronic Cough, Difficulty Breathing and Wheezing. Breast Not Present- Breast Mass, Breast Pain, Nipple Discharge and Skin Changes. Cardiovascular Not Present- Chest Pain, Difficulty Breathing Lying Down, Leg Cramps, Palpitations, Rapid Heart Rate, Shortness of Breath and Swelling of Extremities. Gastrointestinal Not Present- Abdominal Pain, Bloating, Bloody Stool, Change in Bowel Habits,  Chronic diarrhea, Constipation, Difficulty Swallowing, Excessive gas, Gets full quickly at meals, Hemorrhoids, Indigestion, Nausea, Rectal Pain and Vomiting. Female Genitourinary Not Present- Frequency, Nocturia, Painful Urination, Pelvic Pain and Urgency. Musculoskeletal Not Present- Back Pain, Joint Pain, Joint Stiffness, Muscle Pain, Muscle Weakness and Swelling of  Extremities. Neurological Present- Tingling. Not Present- Decreased Memory, Fainting, Headaches, Numbness, Seizures, Tremor, Trouble walking and Weakness. Psychiatric Present- Anxiety and Change in Sleep Pattern. Not Present- Bipolar, Depression, Fearful and Frequent crying. Endocrine Not Present- Cold Intolerance, Excessive Hunger, Hair Changes, Heat Intolerance, Hot flashes and New Diabetes. Hematology Not Present- Easy Bruising, Excessive bleeding, Gland problems, HIV and Persistent Infections.  Vitals Weight: 162.2 lb Height: 70in Body Surface Area: 1.91 m Body Mass Index: 23.27 kg/m  Temp.: 98.46F(Oral)  Pulse: 82 (Regular)  BP: 116/74 (Sitting, Left Arm, Standard)  Physical Exam  General - appears comfortable, no distress; not diaphorectic  HEENT - normocephalic; sclerae clear, gaze conjugate; mucous membranes moist, dentition good; voice normal  Neck - asymmetric on extension; no palpable anterior or posterior cervical adenopathy; in the left thyroid lobe is a smooth, palpable, mobile, nontender 2 cm thyroid nodule; no palpable abnormality in the right lobe  Chest - clear bilaterally without rhonchi, rales, or wheeze  Cor - regular rhythm with normal rate; no significant murmur  Ext - non-tender without significant edema or lymphedema  Neuro - grossly intact; no tremor  Assessment & Plan  NEOPLASM OF UNCERTAIN BEHAVIOR OF THYROID GLAND (D44.0)  Pt Education - Pamphlet Given - The Thyroid Book: discussed with patient and provided information.  Patient presents with a left-sided thyroid nodule  with cytologic atypia by fine-needle aspiration biopsy. Molecular testing reveals a genetic mutation which puts her in a high risk category for malignancy. We have reviewed these reports. I also provided she and her husband with written literature on thyroid surgery to review at home.  After review of the above findings, I have recommended proceeding with total thyroidectomy. Given the fact that the patient will be on thyroid hormone regardless of whether we perform lobectomy or total thyroidectomy, I favor total thyroidectomy. This is also based on the finding of a genetic mutation which places her at an increased risk group. She may or may not require radioactive iodine treatment following surgery. That will depend on the final pathologic results.  We have discussed total thyroidectomy. We discussed the location of the surgical incision. We discussed potential complications and risk including the risk of injury to recurrent laryngeal nerves and parathyroid glands. We have discussed the hospital stay to be anticipated. We discussed postoperative recovery and return to work and activities. We discussed the need for lifelong thyroid hormone replacement. We discussed the potential need for radioactive iodine treatment. The patient and her husband understand and wish to proceed.  The risks and benefits of the procedure have been discussed at length with the patient. The patient understands the proposed procedure, potential alternative treatments, and the course of recovery to be expected. All of the patient's questions have been answered at this time. The patient wishes to proceed with surgery.  Earnstine Regal, MD, [facility] Office: [phone]

## 2015-11-27 NOTE — Progress Notes (Signed)
Pt called to ask if she could take zyrtec and flonase on the am of surgery.  I instructed her that they would be okay to take.

## 2015-11-30 MED ORDER — CEFAZOLIN SODIUM-DEXTROSE 2-3 GM-% IV SOLR
2.0000 g | INTRAVENOUS | Status: AC
  Administered 2015-12-01: 2 g via INTRAVENOUS
  Filled 2015-11-30: qty 50

## 2015-12-01 ENCOUNTER — Encounter (HOSPITAL_COMMUNITY): Payer: Self-pay | Admitting: *Deleted

## 2015-12-01 ENCOUNTER — Ambulatory Visit (HOSPITAL_COMMUNITY): Payer: BLUE CROSS/BLUE SHIELD | Admitting: Anesthesiology

## 2015-12-01 ENCOUNTER — Encounter (HOSPITAL_COMMUNITY)
Admission: RE | Disposition: A | Discharge status: Home / Self Care | Payer: Self-pay | Source: Ambulatory Visit | Attending: Surgery

## 2015-12-01 ENCOUNTER — Observation Stay (HOSPITAL_COMMUNITY)
Admission: RE | Admit: 2015-12-01 | Discharge: 2015-12-02 | Disposition: A | Discharge status: Home / Self Care | Payer: BLUE CROSS/BLUE SHIELD | Source: Ambulatory Visit | Attending: Surgery | Admitting: Surgery

## 2015-12-01 DIAGNOSIS — D440 Neoplasm of uncertain behavior of thyroid gland: Secondary | ICD-10-CM | POA: Diagnosis present

## 2015-12-01 DIAGNOSIS — E063 Autoimmune thyroiditis: Secondary | ICD-10-CM | POA: Insufficient documentation

## 2015-12-01 DIAGNOSIS — Z79899 Other long term (current) drug therapy: Secondary | ICD-10-CM | POA: Insufficient documentation

## 2015-12-01 DIAGNOSIS — C73 Malignant neoplasm of thyroid gland: Principal | ICD-10-CM | POA: Insufficient documentation

## 2015-12-01 DIAGNOSIS — J45909 Unspecified asthma, uncomplicated: Secondary | ICD-10-CM | POA: Insufficient documentation

## 2015-12-01 DIAGNOSIS — Z8349 Family history of other endocrine, nutritional and metabolic diseases: Secondary | ICD-10-CM | POA: Insufficient documentation

## 2015-12-01 DIAGNOSIS — F419 Anxiety disorder, unspecified: Secondary | ICD-10-CM | POA: Insufficient documentation

## 2015-12-01 DIAGNOSIS — Z7951 Long term (current) use of inhaled steroids: Secondary | ICD-10-CM | POA: Insufficient documentation

## 2015-12-01 HISTORY — PX: THYROIDECTOMY: SHX17

## 2015-12-01 SURGERY — THYROIDECTOMY
Anesthesia: General

## 2015-12-01 MED ORDER — LACTATED RINGERS IV SOLN
INTRAVENOUS | Status: DC
  Administered 2015-12-01 (×3): via INTRAVENOUS

## 2015-12-01 MED ORDER — HYDROMORPHONE HCL 1 MG/ML IJ SOLN
INTRAMUSCULAR | Status: AC
  Filled 2015-12-01: qty 1

## 2015-12-01 MED ORDER — LIDOCAINE HCL 4 % EX SOLN
CUTANEOUS | Status: DC | PRN
  Administered 2015-12-01: 4 mL via TOPICAL

## 2015-12-01 MED ORDER — HYDROMORPHONE HCL 1 MG/ML IJ SOLN
INTRAMUSCULAR | Status: AC
  Administered 2015-12-01: 0.25 mg via INTRAVENOUS
  Filled 2015-12-01: qty 1

## 2015-12-01 MED ORDER — ESCITALOPRAM OXALATE 10 MG PO TABS: 10.0000 mg | ORAL_TABLET | Freq: Every day | ORAL | Status: DC

## 2015-12-01 MED ORDER — LIDOCAINE HCL (CARDIAC) 20 MG/ML IV SOLN
INTRAVENOUS | Status: AC
  Filled 2015-12-01: qty 5

## 2015-12-01 MED ORDER — DEXTROSE 5 % IV SOLN
500.0000 mg | Freq: Once | INTRAVENOUS | Status: AC
  Administered 2015-12-01: 500 mg via INTRAVENOUS
  Filled 2015-12-01: qty 5

## 2015-12-01 MED ORDER — HEMOSTATIC AGENTS (NO CHARGE) OPTIME
TOPICAL | Status: DC | PRN
  Administered 2015-12-01: 1 via TOPICAL

## 2015-12-01 MED ORDER — MIDAZOLAM HCL 2 MG/2ML IJ SOLN
INTRAMUSCULAR | Status: AC
  Filled 2015-12-01: qty 2

## 2015-12-01 MED ORDER — OXYCODONE HCL 5 MG/5ML PO SOLN: 5.0000 mg | Freq: Once | ORAL | Status: DC | PRN

## 2015-12-01 MED ORDER — PROPOFOL 10 MG/ML IV BOLUS
INTRAVENOUS | Status: DC | PRN
  Administered 2015-12-01: 200 mg via INTRAVENOUS

## 2015-12-01 MED ORDER — DEXAMETHASONE SODIUM PHOSPHATE 4 MG/ML IJ SOLN
INTRAMUSCULAR | Status: AC
  Filled 2015-12-01: qty 2

## 2015-12-01 MED ORDER — ROCURONIUM BROMIDE 100 MG/10ML IV SOLN
INTRAVENOUS | Status: DC | PRN
  Administered 2015-12-01: 40 mg via INTRAVENOUS

## 2015-12-01 MED ORDER — PHENYLEPHRINE 40 MCG/ML (10ML) SYRINGE FOR IV PUSH (FOR BLOOD PRESSURE SUPPORT)
PREFILLED_SYRINGE | INTRAVENOUS | Status: AC
  Filled 2015-12-01: qty 10

## 2015-12-01 MED ORDER — ONDANSETRON HCL 4 MG/2ML IJ SOLN: 4.0000 mg | Freq: Four times a day (QID) | INTRAMUSCULAR | Status: DC | PRN

## 2015-12-01 MED ORDER — ALBUTEROL SULFATE (2.5 MG/3ML) 0.083% IN NEBU: 2.5000 mg | INHALATION_SOLUTION | Freq: Four times a day (QID) | RESPIRATORY_TRACT | Status: DC | PRN

## 2015-12-01 MED ORDER — 0.9 % SODIUM CHLORIDE (POUR BTL) OPTIME
TOPICAL | Status: DC | PRN
  Administered 2015-12-01: 1000 mL

## 2015-12-01 MED ORDER — MIDAZOLAM HCL 2 MG/2ML IJ SOLN
INTRAMUSCULAR | Status: AC
  Administered 2015-12-01: 1 mg via INTRAVENOUS
  Filled 2015-12-01: qty 2

## 2015-12-01 MED ORDER — PROPOFOL 10 MG/ML IV BOLUS
INTRAVENOUS | Status: AC
  Filled 2015-12-01: qty 20

## 2015-12-01 MED ORDER — ACETAMINOPHEN 325 MG PO TABS
650.0000 mg | ORAL_TABLET | Freq: Four times a day (QID) | ORAL | Status: DC | PRN
Start: 2015-12-01 — End: 2015-12-02

## 2015-12-01 MED ORDER — STERILE WATER FOR INJECTION IJ SOLN
INTRAMUSCULAR | Status: AC
  Filled 2015-12-01: qty 10

## 2015-12-01 MED ORDER — OXYCODONE HCL 5 MG PO TABS: 5.0000 mg | ORAL_TABLET | Freq: Once | ORAL | Status: DC | PRN

## 2015-12-01 MED ORDER — EPHEDRINE SULFATE 50 MG/ML IJ SOLN
INTRAMUSCULAR | Status: DC | PRN
  Administered 2015-12-01: 10 mg via INTRAVENOUS

## 2015-12-01 MED ORDER — FENTANYL CITRATE (PF) 250 MCG/5ML IJ SOLN
INTRAMUSCULAR | Status: AC
  Filled 2015-12-01: qty 5

## 2015-12-01 MED ORDER — ONDANSETRON HCL 4 MG/2ML IJ SOLN
INTRAMUSCULAR | Status: AC
  Filled 2015-12-01: qty 2

## 2015-12-01 MED ORDER — ACETAMINOPHEN 650 MG RE SUPP: 650.0000 mg | Freq: Four times a day (QID) | RECTAL | Status: DC | PRN

## 2015-12-01 MED ORDER — HYDROCODONE-ACETAMINOPHEN 5-325 MG PO TABS
1.0000 | ORAL_TABLET | ORAL | Status: DC | PRN
  Administered 2015-12-01 – 2015-12-02 (×4): 1 via ORAL
  Filled 2015-12-01: qty 1
  Filled 2015-12-01: qty 2
  Filled 2015-12-01 (×2): qty 1

## 2015-12-01 MED ORDER — ONDANSETRON 4 MG PO TBDP: 4.0000 mg | ORAL_TABLET | Freq: Four times a day (QID) | ORAL | Status: DC | PRN

## 2015-12-01 MED ORDER — ROCURONIUM BROMIDE 50 MG/5ML IV SOLN
INTRAVENOUS | Status: AC
  Filled 2015-12-01: qty 1

## 2015-12-01 MED ORDER — LIDOCAINE HCL (CARDIAC) 20 MG/ML IV SOLN
INTRAVENOUS | Status: DC | PRN
  Administered 2015-12-01: 60 mg via INTRAVENOUS

## 2015-12-01 MED ORDER — BUDESONIDE 0.5 MG/2ML IN SUSP
0.5000 mg | Freq: Two times a day (BID) | RESPIRATORY_TRACT | Status: DC
  Filled 2015-12-01: qty 2

## 2015-12-01 MED ORDER — CALCIUM CARBONATE 1250 (500 CA) MG PO TABS
2.0000 | ORAL_TABLET | Freq: Three times a day (TID) | ORAL | Status: DC
  Administered 2015-12-01 – 2015-12-02 (×2): 1000 mg via ORAL
  Filled 2015-12-01: qty 1
  Filled 2015-12-01: qty 2
  Filled 2015-12-01: qty 1

## 2015-12-01 MED ORDER — ALBUTEROL SULFATE HFA 108 (90 BASE) MCG/ACT IN AERS: 2.0000 | INHALATION_SPRAY | Freq: Four times a day (QID) | RESPIRATORY_TRACT | Status: DC | PRN

## 2015-12-01 MED ORDER — HYDROMORPHONE HCL 1 MG/ML IJ SOLN
0.2500 mg | INTRAMUSCULAR | Status: DC | PRN
  Administered 2015-12-01: 0.75 mg via INTRAVENOUS
  Administered 2015-12-01 (×2): 0.5 mg via INTRAVENOUS
  Administered 2015-12-01: 0.25 mg via INTRAVENOUS

## 2015-12-01 MED ORDER — ONDANSETRON HCL 4 MG/2ML IJ SOLN
INTRAMUSCULAR | Status: DC | PRN
  Administered 2015-12-01 (×2): 4 mg via INTRAVENOUS

## 2015-12-01 MED ORDER — EPHEDRINE SULFATE 50 MG/ML IJ SOLN
INTRAMUSCULAR | Status: AC
  Filled 2015-12-01: qty 1

## 2015-12-01 MED ORDER — FLUTICASONE PROPIONATE 50 MCG/ACT NA SUSP
1.0000 | Freq: Two times a day (BID) | NASAL | Status: DC
  Filled 2015-12-01: qty 16

## 2015-12-01 MED ORDER — NEOSTIGMINE METHYLSULFATE 10 MG/10ML IV SOLN
INTRAVENOUS | Status: DC | PRN
  Administered 2015-12-01: 2 mg via INTRAVENOUS

## 2015-12-01 MED ORDER — DEXAMETHASONE SODIUM PHOSPHATE 4 MG/ML IJ SOLN
INTRAMUSCULAR | Status: DC | PRN
  Administered 2015-12-01: 8 mg via INTRAVENOUS

## 2015-12-01 MED ORDER — DEXTROSE 5 % IV SOLN: 500.0000 mg | Freq: Three times a day (TID) | INTRAVENOUS | Status: DC

## 2015-12-01 MED ORDER — PHENYLEPHRINE HCL 10 MG/ML IJ SOLN
10.0000 mg | INTRAVENOUS | Status: DC | PRN
  Administered 2015-12-01: 10 ug/min via INTRAVENOUS

## 2015-12-01 MED ORDER — HYDROMORPHONE HCL 1 MG/ML IJ SOLN
0.5000 mg | INTRAMUSCULAR | Status: DC | PRN
  Administered 2015-12-01: 0.5 mg via INTRAVENOUS

## 2015-12-01 MED ORDER — GLYCOPYRROLATE 0.2 MG/ML IJ SOLN
INTRAMUSCULAR | Status: DC | PRN
  Administered 2015-12-01: 0.2 mg via INTRAVENOUS

## 2015-12-01 MED ORDER — MIDAZOLAM HCL 5 MG/5ML IJ SOLN
INTRAMUSCULAR | Status: DC | PRN
  Administered 2015-12-01: 2 mg via INTRAVENOUS

## 2015-12-01 MED ORDER — MIDAZOLAM HCL 2 MG/2ML IJ SOLN
1.0000 mg | Freq: Once | INTRAMUSCULAR | Status: AC
  Administered 2015-12-01: 1 mg via INTRAVENOUS

## 2015-12-01 MED ORDER — HYDROMORPHONE HCL 1 MG/ML IJ SOLN: 1.0000 mg | INTRAMUSCULAR | Status: DC | PRN

## 2015-12-01 MED ORDER — FENTANYL CITRATE (PF) 100 MCG/2ML IJ SOLN
INTRAMUSCULAR | Status: DC | PRN
Start: 2015-12-01 — End: 2015-12-01
  Administered 2015-12-01 (×2): 50 ug via INTRAVENOUS
  Administered 2015-12-01: 100 ug via INTRAVENOUS
  Administered 2015-12-01: 50 ug via INTRAVENOUS

## 2015-12-01 MED ORDER — PROMETHAZINE HCL 25 MG/ML IJ SOLN: 6.2500 mg | INTRAMUSCULAR | Status: DC | PRN

## 2015-12-01 MED ORDER — HYDROMORPHONE HCL 1 MG/ML IJ SOLN
INTRAMUSCULAR | Status: AC
  Administered 2015-12-01: 0.5 mg via INTRAVENOUS
  Filled 2015-12-01: qty 1

## 2015-12-01 MED ORDER — KCL IN DEXTROSE-NACL 20-5-0.45 MEQ/L-%-% IV SOLN
INTRAVENOUS | Status: DC
  Administered 2015-12-01: 19:00:00 via INTRAVENOUS
  Filled 2015-12-01: qty 1000

## 2015-12-01 SURGICAL SUPPLY — 58 items
APL SKNCLS STERI-STRIP NONHPOA (GAUZE/BANDAGES/DRESSINGS) ×1
ATTRACTOMAT 16X20 MAGNETIC DRP (DRAPES) ×2 IMPLANT
BENZOIN TINCTURE PRP APPL 2/3 (GAUZE/BANDAGES/DRESSINGS) ×2 IMPLANT
BLADE SURG 10 STRL SS (BLADE) ×2 IMPLANT
BLADE SURG 15 STRL LF DISP TIS (BLADE) ×1 IMPLANT
BLADE SURG 15 STRL SS (BLADE) ×2
BLADE SURG ROTATE 9660 (MISCELLANEOUS) IMPLANT
CANISTER SUCTION 2500CC (MISCELLANEOUS) ×2 IMPLANT
CHLORAPREP W/TINT 10.5 ML (MISCELLANEOUS) ×2 IMPLANT
CLIP TI MEDIUM 24 (CLIP) ×3 IMPLANT
CLIP TI WIDE RED SMALL 24 (CLIP) ×2 IMPLANT
CLSR STERI-STRIP ANTIMIC 1/2X4 (GAUZE/BANDAGES/DRESSINGS) ×1 IMPLANT
CONT SPEC 4OZ CLIKSEAL STRL BL (MISCELLANEOUS) IMPLANT
COVER SURGICAL LIGHT HANDLE (MISCELLANEOUS) ×2 IMPLANT
CRADLE DONUT ADULT HEAD (MISCELLANEOUS) ×2 IMPLANT
DRAPE LAPAROTOMY 100X72 PEDS (DRAPES) ×1 IMPLANT
DRAPE LAPAROTOMY T 98X78 PEDS (DRAPES) ×2 IMPLANT
DRAPE UTILITY XL STRL (DRAPES) ×2 IMPLANT
ELECT CAUTERY BLADE 6.4 (BLADE) ×2 IMPLANT
ELECT REM PT RETURN 9FT ADLT (ELECTROSURGICAL) ×2
ELECTRODE REM PT RTRN 9FT ADLT (ELECTROSURGICAL) ×1 IMPLANT
GAUZE SPONGE 4X4 12PLY STRL (GAUZE/BANDAGES/DRESSINGS) ×2 IMPLANT
GAUZE SPONGE 4X4 16PLY XRAY LF (GAUZE/BANDAGES/DRESSINGS) ×2 IMPLANT
GLOVE BIOGEL PI IND STRL 7.0 (GLOVE) IMPLANT
GLOVE BIOGEL PI IND STRL 8 (GLOVE) IMPLANT
GLOVE BIOGEL PI INDICATOR 7.0 (GLOVE) ×2
GLOVE BIOGEL PI INDICATOR 8 (GLOVE) ×2
GLOVE SURG ORTHO 8.0 STRL STRW (GLOVE) ×2 IMPLANT
GLOVE SURG SS PI 6.5 STRL IVOR (GLOVE) ×1 IMPLANT
GLOVE SURG SS PI 7.0 STRL IVOR (GLOVE) ×1 IMPLANT
GLOVE SURG SS PI 8.0 STRL IVOR (GLOVE) ×1 IMPLANT
GOWN STRL REUS W/ TWL LRG LVL3 (GOWN DISPOSABLE) ×2 IMPLANT
GOWN STRL REUS W/ TWL XL LVL3 (GOWN DISPOSABLE) ×1 IMPLANT
GOWN STRL REUS W/TWL LRG LVL3 (GOWN DISPOSABLE) ×4
GOWN STRL REUS W/TWL XL LVL3 (GOWN DISPOSABLE) ×2
HEMOSTAT SURGICEL 2X4 FIBR (HEMOSTASIS) ×2 IMPLANT
KIT BASIN OR (CUSTOM PROCEDURE TRAY) ×2 IMPLANT
KIT ROOM TURNOVER OR (KITS) ×2 IMPLANT
LIGHT WAVEGUIDE WIDE FLAT (MISCELLANEOUS) ×1 IMPLANT
NS IRRIG 1000ML POUR BTL (IV SOLUTION) ×2 IMPLANT
PACK SURGICAL SETUP 50X90 (CUSTOM PROCEDURE TRAY) ×2 IMPLANT
PAD ARMBOARD 7.5X6 YLW CONV (MISCELLANEOUS) ×2 IMPLANT
PENCIL BUTTON HOLSTER BLD 10FT (ELECTRODE) ×2 IMPLANT
SHEARS HARMONIC 9CM CVD (BLADE) ×2 IMPLANT
SPECIMEN JAR MEDIUM (MISCELLANEOUS) IMPLANT
SPONGE GAUZE 4X4 12PLY STER LF (GAUZE/BANDAGES/DRESSINGS) ×1 IMPLANT
SPONGE INTESTINAL PEANUT (DISPOSABLE) ×2 IMPLANT
STRIP CLOSURE SKIN 1/2X4 (GAUZE/BANDAGES/DRESSINGS) ×2 IMPLANT
SUT MNCRL AB 4-0 PS2 18 (SUTURE) ×2 IMPLANT
SUT SILK 2 0 (SUTURE) ×2
SUT SILK 2-0 18XBRD TIE 12 (SUTURE) ×1 IMPLANT
SUT VIC AB 3-0 SH 18 (SUTURE) ×2 IMPLANT
SUT VIC AB 3-0 SH 8-18 (SUTURE) ×1 IMPLANT
SYR BULB 3OZ (MISCELLANEOUS) ×2 IMPLANT
TAPE CLOTH SURG 4X10 WHT LF (GAUZE/BANDAGES/DRESSINGS) ×1 IMPLANT
TOWEL OR 17X24 6PK STRL BLUE (TOWEL DISPOSABLE) ×2 IMPLANT
TOWEL OR 17X26 10 PK STRL BLUE (TOWEL DISPOSABLE) ×2 IMPLANT
TUBE CONNECTING 12X1/4 (SUCTIONS) ×2 IMPLANT

## 2015-12-01 NOTE — Op Note (Signed)
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 Legrand Como Altheimer for evaluation of thyroid neoplasm of uncertain behavior suspicious for malignancy. Patient's primary care physician is Dr. Cristie Hem Plotnikov. Patient was diagnosed with hypothyroidism due to Hashimoto's thyroiditis and summer of 2016. Patient was started on Synthroid but had some untoward reactions, mainly with anxiety. She also developed fullness and discomfort in the left neck. Patient underwent thyroid ultrasound on October 14, 2015. This shows a normal right thyroid lobe. Left thyroid lobe measured 4.2 cm and a complex nodule measuring 1.7 cm was noted containing microcalcifications. Ultrasound-guided fine-needle aspiration biopsy was performed on October 19, 2015. This showed cytologic atypia of undetermined significance, Bethesda category III. specimen was sent for [facility] testing. This returned as suspicious based on their gene expression panel, placing her risk of malignancy at approximately 40%. However, the test for BRAF was positive, making her risk of malignancy 99%.   Procedure Details: Procedure was done in OR #9 at the [facility].  The patient was brought to the operating room and placed in a supine position on the operating room table.  Following administration of general anesthesia, the patient was positioned and then prepped and draped in the usual aseptic fashion.  After ascertaining that an adequate level of anesthesia had been achieved, a Kocher incision was made with #15 blade.  Dissection was carried through  subcutaneous tissues and platysma. Hemostasis was achieved with the electrocautery.  Skin flaps were elevated cephalad and caudad from the thyroid notch to the sternal notch.  The Mahorner self-retaining retractor was placed for exposure.  Strap muscles were incised in the midline and dissection was begun on the left side.  Strap muscles were reflected laterally.  Left thyroid lobe was mildly enlarged and firm.  The left lobe was gently mobilized with blunt dissection.  Superior pole vessels were dissected out and divided individually between small and medium Ligaclips with the Harmonic scalpel.  The thyroid lobe was rolled anteriorly.  Branches of the inferior thyroid artery were divided between small Ligaclips with the Harmonic scalpel.  Inferior venous tributaries were divided between Ligaclips.  Both the superior and inferior parathyroid glands were identified and preserved on their vascular pedicles.  The recurrent laryngeal nerve was identified and preserved along its course.  The ligament of Gwenlyn Found was released with the electrocautery and the gland was mobilized onto the anterior trachea. Isthmus was mobilized across the midline.  There was a small pyramidal lobe present which was dissected off the thyroid cartilage and resected en bloc with the isthmus.  Dry pack was placed in the left neck.  Next, the right thyroid lobe was gently mobilized with blunt dissection.  Right thyroid lobe was small and mildly nodular.  Superior pole vessels were dissected out and divided between small and medium Ligaclips with the Harmonic scalpel.  Superior parathyroid was identified and preserved.  Inferior venous tributaries were divided between medium Ligaclips with the Harmonic scalpel.  The right thyroid lobe was rolled anteriorly and the branches of the inferior thyroid artery divided between small Ligaclips.  The right recurrent laryngeal nerve  was identified and preserved along its course.  The ligament of Gwenlyn Found was  released with the electrocautery.  The right thyroid lobe was mobilized onto the anterior trachea and the remainder of the thyroid was dissected off the anterior trachea and the thyroid was completely excised.  A suture was used to mark the left lobe. The entire thyroid gland was submitted to pathology for review.  A sample of central compartment lymph nodes was removed from the area in front of the trachea.  Approximately three lymph nodes were identified in the adipose tissue and resected using the electrocautery for hemostasis and a small ligaclip.  The nodes were submitted as a separate specimen to pathology for review.  The neck was irrigated with warm saline.  Fibrillar was placed throughout the operative field.  Strap muscles were reapproximated in the midline with interrupted 3-0 Vicryl sutures.  Platysma was closed with interrupted 3-0 Vicryl sutures.  Skin was closed with a running 4-0 Monocryl subcuticular suture.  Wound was washed and dried and benzoin and steri-strips were applied.  Dry gauze dressing was placed.  The patient was awakened from anesthesia and brought to the recovery room.  The patient tolerated the procedure well.   Earnstine Regal, MD, [facility] Office: [phone]

## 2015-12-01 NOTE — Anesthesia Preprocedure Evaluation (Signed)
Anesthesia Evaluation  Patient identified by MRN, date of birth, ID band Patient awake    Reviewed: Allergy & Precautions, NPO status , Patient's Chart, lab work & pertinent test results  Airway Mallampati: II  TM Distance: >3 FB Neck ROM: Full    Dental  (+) Dental Advisory Given   Pulmonary asthma ,    breath sounds clear to auscultation       Cardiovascular negative cardio ROS   Rhythm:Regular Rate:Normal     Neuro/Psych Anxiety Depression negative neurological ROS     GI/Hepatic negative GI ROS, Neg liver ROS,   Endo/Other  negative endocrine ROS  Renal/GU negative Renal ROS     Musculoskeletal   Abdominal   Peds  Hematology negative hematology ROS (+)   Anesthesia Other Findings   Reproductive/Obstetrics                             Lab Results  Component Value Date   WBC 5.3 11/23/2015   HGB 12.2 11/23/2015   HCT 38.2 11/23/2015   MCV 92.7 11/23/2015   PLT 231 11/23/2015   Lab Results  Component Value Date   CREATININE 0.74 03/23/2015   BUN 10 03/23/2015   NA 137 03/23/2015   K 4.5 03/23/2015   CL 107 03/23/2015   CO2 26 03/23/2015    Anesthesia Physical Anesthesia Plan  ASA: II  Anesthesia Plan: General   Post-op Pain Management:    Induction: Intravenous  Airway Management Planned: Oral ETT  Additional Equipment:   Intra-op Plan:   Post-operative Plan: Extubation in OR  Informed Consent: I have reviewed the patients History and Physical, chart, labs and discussed the procedure including the risks, benefits and alternatives for the proposed anesthesia with the patient or authorized representative who has indicated his/her understanding and acceptance.   Dental advisory given  Plan Discussed with: CRNA  Anesthesia Plan Comments:         Anesthesia Quick Evaluation

## 2015-12-01 NOTE — Anesthesia Postprocedure Evaluation (Signed)
Anesthesia Post Note  Patient: Amy Bautista  Procedure(s) Performed: Procedure(s) (LRB): TOTAL THYROIDECTOMY (N/A)  Patient location during evaluation: PACU Anesthesia Type: General Level of consciousness: awake and alert Pain management: pain level controlled Vital Signs Assessment: post-procedure vital signs reviewed and stable Respiratory status: spontaneous breathing, nonlabored ventilation, respiratory function stable and patient connected to nasal cannula oxygen Cardiovascular status: blood pressure returned to baseline and stable Postop Assessment: no signs of nausea or vomiting Anesthetic complications: no    Last Vitals:  Filed Vitals:   12/01/15 1700 12/01/15 1715  BP: 115/64 121/65  Pulse: 75 60  Temp:    Resp: 15 17    Last Pain:  Filed Vitals:   12/01/15 1722  PainSc: 5                  Stone Spirito,W. EDMOND

## 2015-12-01 NOTE — Interval H&P Note (Signed)
History and Physical Interval Note:  12/01/2015 1:24 PM  Amy Bautista  has presented today for surgery, with the diagnosis of Thyroid neoplasm of uncertain behavior.  The various methods of treatment have been discussed with the patient and family. After consideration of risks, benefits and other options for treatment, the patient has consented to    Procedure(s): TOTAL THYROIDECTOMY (N/A) as a surgical intervention .    The patient's history has been reviewed, patient examined, no change in status, stable for surgery.  I have reviewed the patient's chart and labs.  Questions were answered to the patient's satisfaction.    Earnstine Regal, MD, [facility] Office: [place]

## 2015-12-01 NOTE — Transfer of Care (Signed)
Immediate Anesthesia Transfer of Care Note  Patient: Amy Bautista  Procedure(s) Performed: Procedure(s): TOTAL THYROIDECTOMY (N/A)  Patient Location: PACU  Anesthesia Type:General  Level of Consciousness: awake, alert , oriented and patient cooperative  Airway & Oxygen Therapy: Patient Spontanous Breathing  Post-op Assessment: Report given to RN and Post -op Vital signs reviewed and stable  Post vital signs: Reviewed and stable  Last Vitals:  Filed Vitals:   12/01/15 1545 12/01/15 1546  BP: 119/71   Pulse: 68   Temp:  36.8 C  Resp: 5     Complications: No apparent anesthesia complications

## 2015-12-02 ENCOUNTER — Encounter (HOSPITAL_COMMUNITY): Payer: Self-pay | Admitting: Surgery

## 2015-12-02 DIAGNOSIS — C73 Malignant neoplasm of thyroid gland: Secondary | ICD-10-CM | POA: Diagnosis not present

## 2015-12-02 LAB — BASIC METABOLIC PANEL
ANION GAP: 9 (ref 5–15)
BUN: 7 mg/dL (ref 6–20)
CO2: 24 mmol/L (ref 22–32)
Calcium: 8.5 mg/dL — ABNORMAL LOW (ref 8.9–10.3)
Chloride: 104 mmol/L (ref 101–111)
Creatinine, Ser: 0.76 mg/dL (ref 0.44–1.00)
GFR calc Af Amer: >60 mL/min (ref >60)
GLUCOSE: 106 mg/dL — AB (ref 65–99)
POTASSIUM: 3.8 mmol/L (ref 3.5–5.1)
Sodium: 137 mmol/L (ref 135–145)

## 2015-12-02 MED ORDER — CALCIUM CARBONATE 1250 (500 CA) MG PO TABS: 2.0000 | ORAL_TABLET | Freq: Two times a day (BID) | ORAL | Status: DC

## 2015-12-02 MED ORDER — SODIUM CHLORIDE 0.9 % IV SOLN
1.0000 g | INTRAVENOUS | Status: AC
  Administered 2015-12-02: 1 g via INTRAVENOUS
  Filled 2015-12-02: qty 10

## 2015-12-02 MED ORDER — HYDROCODONE-ACETAMINOPHEN 5-325 MG PO TABS: 1.0000 | ORAL_TABLET | ORAL | Status: DC | PRN

## 2015-12-02 NOTE — Progress Notes (Signed)
Discharge papers gone over with pt. And pt.'s husband. No questions/complaints. IV taken out. Pt. Discharged successfully with husband. Pt. Refused w/c for discharge.

## 2015-12-02 NOTE — Discharge Summary (Signed)
Physician Discharge Summary [facility]  Patient ID: Amy Bautista MRN: [id number] DOB/AGE: 10-10-1975 40 y.o.  Admit date: 12/01/2015 Discharge date: 12/02/2015  Admission Diagnoses:  Thyroid neoplasm of uncertain behavior, suspicious for papillary thyroid carcinoma  Discharge Diagnoses:  Principal Problem:   Neoplasm of uncertain behavior of thyroid gland   Discharged Condition: good  Hospital Course: Patient was admitted for observation following thyroid surgery.  Post op course was uncomplicated.  Pain was well controlled.  Tolerated diet.  Post op calcium level on morning following surgery was 8.5 mg/dl.  Calcium gluconate 1 gm given IV prior to discharge.  Patient was prepared for discharge home on POD#1.  Consults: None  Treatments: surgery: total thyroidectomy with limited lymph node dissection  Discharge Exam: Blood pressure 103/53, pulse 60, temperature 97.9 F (36.6 C), temperature source Oral, resp. rate 18, weight 74.39 kg (164 lb), last menstrual period 11/17/2015, SpO2 95 %. HEENT - clear Neck - wound dry and intact; minimal STS; voice normal Chest - clear bilaterally Cor - RRR  Disposition: Home  Discharge Instructions    Apply dressing    Complete by:  As directed   Apply light gauze dressing to wound before discharge home today.     Diet - low sodium heart healthy    Complete by:  As directed      Discharge instructions    Complete by:  As directed   [facility]  THYROID & PARATHYROID SURGERY:  POST-OP INSTRUCTIONS  Always review your discharge instruction sheet from the facility where your surgery was performed.  A prescription for pain medication may be given to you upon discharge.  Take your pain medication as prescribed.  If narcotic pain medicine is not needed, then you may take acetaminophen (Tylenol) or ibuprofen (Advil) as needed.  Take your usually prescribed medications unless otherwise  directed.  If you need a refill on your pain medication, please contact your pharmacy. They will contact our office to request authorization.  Prescriptions will not be processed by our office after 5 pm or on weekends.  Start with a light diet upon arrival home, such as soup and crackers or toast.  Be sure to drink plenty of fluids daily.  Resume your normal diet the day after surgery.  Most patients will experience some swelling and bruising on the chest and neck area.  Ice packs will help.  Swelling and bruising can take several days to resolve.   It is common to experience some constipation after surgery.  Increasing fluid intake and taking a stool softener will usually help or prevent this problem.  A mild laxative (Milk of Magnesia or Miralax) should be taken according to package directions if there has been no bowel movement after 48 hours.  You have steri-strips and a gauze dressing over your incision.  You may remove the gauze bandage on the second day after surgery, and you may shower at that time.  Leave your steri-strips (small skin tapes) in place directly over the incision.  These strips should remain on the skin for 5-7 days and then be removed.  You may get them wet in the shower and pat them dry.  You may resume regular (light) daily activities beginning the next day - such as daily self-care, walking, climbing stairs - gradually increasing activities as tolerated.  You may have sexual intercourse when it is comfortable.  Refrain from any heavy lifting or straining until approved by your doctor.  You  may drive when you no longer are taking prescription pain medication, you can comfortably wear a seatbelt, and you can safely maneuver your car and apply brakes.  You should see your doctor in the office for a follow-up appointment approximately two to three weeks after your surgery.  Make sure that you call for this appointment within a day or two after you arrive home to insure a  convenient appointment time.  WHEN TO CALL YOUR DOCTOR: -- Fever greater than 101.5 -- Inability to urinate -- Nausea and/or vomiting - persistent -- Extreme swelling or bruising -- Continued bleeding from incision -- Increased pain, redness, or drainage from the incision -- Difficulty swallowing or breathing -- Muscle cramping or spasms -- Numbness or tingling in hands or around lips  The clinic staff is available to answer your questions during regular business hours.  Please don't hesitate to call and ask to speak to one of the nurses if you have concerns.  Earnstine Regal, MD, [facility] Office: [phone]  Website: [email or web address]     Increase activity slowly    Complete by:  As directed      Remove dressing in 24 hours    Complete by:  As directed             Medication List    TAKE these medications        acetaminophen 325 MG tablet  Commonly known as:  TYLENOL  Take 650 mg by mouth every 6 (six) hours as needed for moderate pain.     albuterol 108 (90 Base) MCG/ACT inhaler  Commonly known as:  PROAIR HFA  Inhale 2 puffs into the lungs every 6 (six) hours as needed for wheezing or shortness of breath.     ALPRAZolam 0.25 MG tablet  Commonly known as:  XANAX  take 1 tablet by mouth twice a day     budesonide 180 MCG/ACT inhaler  Commonly known as:  PULMICORT  Inhale into the lungs 2 (two) times daily.     calcium carbonate 1250 (500 Ca) MG tablet  Commonly known as:  OS-CAL - dosed in mg of elemental calcium  Take 2 tablets (1,000 mg of elemental calcium total) by mouth 2 (two) times daily with a meal.     cholecalciferol 1000 units tablet  Commonly known as:  VITAMIN D  Take 2 tablets (2,000 Units total) by mouth daily.     EPIPEN 2-PAK 0.3 mg/0.3 mL Soaj injection  Generic drug:  EPINEPHrine  inject as directed if needed for SEVERE ALLERGIC REACTION     escitalopram 10 MG tablet   Commonly known as:  LEXAPRO  Take 10 mg by mouth daily.     fluticasone 50 MCG/ACT nasal spray  Commonly known as:  FLONASE  Place 1 spray into both nostrils 2 (two) times daily.     HYDROcodone-acetaminophen 5-325 MG tablet  Commonly known as:  NORCO/VICODIN  Take 1-2 tablets by mouth every 4 (four) hours as needed for moderate pain.     MELATIN PO  Take 2 mg by mouth at bedtime.     multivitamin tablet  Take 1 tablet by mouth daily.     QVAR 80 MCG/ACT inhaler  Generic drug:  beclomethasone  inhale 1 puff by mouth twice a day     ZYRTEC PO  Take 1 tablet by mouth daily.           Follow-up Information    Follow  up with Earnstine Regal, MD. Schedule an appointment as soon as possible for a visit in 3 weeks.   Specialty:  General Surgery   Why:  For wound re-check   Contact information:   [place] [address] [phone]       Earnstine Regal, MD, [facility] Office: [phone]   Signed: Earnstine Regal 12/02/2015, 7:35 AM

## 2015-12-29 ENCOUNTER — Ambulatory Visit (INDEPENDENT_AMBULATORY_CARE_PROVIDER_SITE_OTHER): Payer: BLUE CROSS/BLUE SHIELD | Admitting: Psychology

## 2015-12-29 DIAGNOSIS — F411 Generalized anxiety disorder: Secondary | ICD-10-CM | POA: Diagnosis not present

## 2016-01-25 ENCOUNTER — Ambulatory Visit: Payer: Self-pay

## 2016-02-08 ENCOUNTER — Ambulatory Visit: Payer: Self-pay

## 2016-03-14 ENCOUNTER — Ambulatory Visit: Payer: Self-pay

## 2016-03-15 ENCOUNTER — Other Ambulatory Visit: Payer: Self-pay

## 2016-03-21 ENCOUNTER — Ambulatory Visit: Payer: Self-pay | Admitting: Endocrinology

## 2016-04-05 ENCOUNTER — Telehealth: Payer: Self-pay | Admitting: Internal Medicine

## 2016-04-05 NOTE — Telephone Encounter (Signed)
Patient called and needs prescription refill for ALPRAZolam (XANAX) 0.25 MG tablet. Leaving to go out of town and needs to pick it up before then.

## 2016-04-05 NOTE — Telephone Encounter (Signed)
OK to fill this prescription with additional refills x1 Thank you!  

## 2016-04-06 MED ORDER — ALPRAZOLAM 0.25 MG PO TABS: ORAL_TABLET | ORAL | Status: DC

## 2016-04-06 NOTE — Telephone Encounter (Signed)
[facility] called [facility] spoke with [place] gave md approval.../lmb

## 2016-04-06 NOTE — Telephone Encounter (Signed)
Patient is leaving town at [employer].  Would like script sent this morning please.  Please call patient once sent to pharmacy at [phone].

## 2016-07-09 ENCOUNTER — Ambulatory Visit: Payer: Self-pay

## 2016-07-16 ENCOUNTER — Other Ambulatory Visit: Payer: Self-pay | Admitting: Internal Medicine

## 2016-07-19 ENCOUNTER — Telehealth: Payer: Self-pay | Admitting: Internal Medicine

## 2016-07-19 MED ORDER — ALBUTEROL SULFATE HFA 108 (90 BASE) MCG/ACT IN AERS: 2.0000 | INHALATION_SPRAY | Freq: Four times a day (QID) | RESPIRATORY_TRACT | 1 refills | Status: DC | PRN

## 2016-07-19 NOTE — Telephone Encounter (Signed)
Pt called in and needs refill on her proair.    [facility] [address] on file   Leaving town this weekend, needs this today

## 2016-07-19 NOTE — Telephone Encounter (Signed)
Refill sent. See meds. Please call pt and schedule OV with PCP.

## 2016-08-01 DIAGNOSIS — C73 Malignant neoplasm of thyroid gland: Secondary | ICD-10-CM | POA: Diagnosis not present

## 2016-08-01 DIAGNOSIS — E890 Postprocedural hypothyroidism: Secondary | ICD-10-CM | POA: Diagnosis not present

## 2016-08-03 DIAGNOSIS — E063 Autoimmune thyroiditis: Secondary | ICD-10-CM | POA: Diagnosis not present

## 2016-08-03 DIAGNOSIS — E890 Postprocedural hypothyroidism: Secondary | ICD-10-CM | POA: Diagnosis not present

## 2016-08-03 DIAGNOSIS — Z23 Encounter for immunization: Secondary | ICD-10-CM | POA: Diagnosis not present

## 2016-08-03 DIAGNOSIS — C73 Malignant neoplasm of thyroid gland: Secondary | ICD-10-CM | POA: Diagnosis not present

## 2016-09-27 DIAGNOSIS — J018 Other acute sinusitis: Secondary | ICD-10-CM | POA: Diagnosis not present

## 2016-09-27 DIAGNOSIS — R05 Cough: Secondary | ICD-10-CM | POA: Diagnosis not present

## 2016-10-03 ENCOUNTER — Other Ambulatory Visit: Payer: Self-pay | Admitting: Internal Medicine

## 2016-10-03 NOTE — Telephone Encounter (Signed)
Routing to [employer], NP in the absence of dr plotnikov---patient not seen by dr Alain Marion since 03/2015, but asthma is on her problem list---are you ok with refilling, or do we need office visit---please advise, thanks

## 2016-10-10 DIAGNOSIS — F411 Generalized anxiety disorder: Secondary | ICD-10-CM | POA: Diagnosis not present

## 2016-11-02 DIAGNOSIS — E890 Postprocedural hypothyroidism: Secondary | ICD-10-CM | POA: Diagnosis not present

## 2016-11-02 DIAGNOSIS — E559 Vitamin D deficiency, unspecified: Secondary | ICD-10-CM | POA: Diagnosis not present

## 2016-11-06 DIAGNOSIS — E890 Postprocedural hypothyroidism: Secondary | ICD-10-CM | POA: Insufficient documentation

## 2016-11-06 DIAGNOSIS — E063 Autoimmune thyroiditis: Secondary | ICD-10-CM | POA: Diagnosis not present

## 2016-11-06 DIAGNOSIS — D649 Anemia, unspecified: Secondary | ICD-10-CM | POA: Diagnosis not present

## 2016-11-06 DIAGNOSIS — C73 Malignant neoplasm of thyroid gland: Secondary | ICD-10-CM | POA: Diagnosis not present

## 2016-11-07 ENCOUNTER — Other Ambulatory Visit: Payer: Self-pay | Admitting: Gynecology

## 2016-11-07 DIAGNOSIS — Z1231 Encounter for screening mammogram for malignant neoplasm of breast: Secondary | ICD-10-CM

## 2016-11-12 ENCOUNTER — Ambulatory Visit: Payer: Self-pay

## 2016-11-12 ENCOUNTER — Encounter: Payer: BLUE CROSS/BLUE SHIELD | Admitting: Gynecology

## 2016-12-03 DIAGNOSIS — J111 Influenza due to unidentified influenza virus with other respiratory manifestations: Secondary | ICD-10-CM | POA: Diagnosis not present

## 2017-01-28 ENCOUNTER — Encounter: Payer: BLUE CROSS/BLUE SHIELD | Admitting: Gynecology

## 2017-02-07 ENCOUNTER — Ambulatory Visit (INDEPENDENT_AMBULATORY_CARE_PROVIDER_SITE_OTHER): Payer: BLUE CROSS/BLUE SHIELD | Admitting: Gynecology

## 2017-02-07 ENCOUNTER — Encounter: Payer: Self-pay | Admitting: Gynecology

## 2017-02-07 VITALS — BP 116/72 | Ht 70.0 in | Wt 165.0 lb

## 2017-02-07 DIAGNOSIS — J45909 Unspecified asthma, uncomplicated: Secondary | ICD-10-CM

## 2017-02-07 DIAGNOSIS — Z01419 Encounter for gynecological examination (general) (routine) without abnormal findings: Secondary | ICD-10-CM | POA: Diagnosis not present

## 2017-02-07 MED ORDER — ALBUTEROL SULFATE HFA 108 (90 BASE) MCG/ACT IN AERS: 2.0000 | INHALATION_SPRAY | Freq: Four times a day (QID) | RESPIRATORY_TRACT | 6 refills | Status: DC | PRN

## 2017-02-07 MED ORDER — BECLOMETHASONE DIPROPIONATE 80 MCG/ACT IN AERS: 1.0000 | INHALATION_SPRAY | Freq: Every day | RESPIRATORY_TRACT | 12 refills | Status: DC

## 2017-02-07 NOTE — Progress Notes (Signed)
    Amy Bautista Amy Bautista Feb 19, 1976 [id number]        41 y.o.  G2P2 for annual exam.    Past medical history,surgical history, problem list, medications, allergies, family history and social history were all reviewed and documented as reviewed in the EPIC chart.  ROS:  Performed with pertinent positives and negatives included in the history, assessment and plan.   Additional significant findings :  None   Exam: Amy Bautista assistant Vitals:   02/07/17 0806  BP: 116/72  Weight: 165 lb (74.8 kg)  Height: 5\' 10"  (1.778 m)   Body mass index is 23.68 kg/m.  General appearance:  Normal affect, orientation and appearance. Skin: Grossly normal HEENT: Without gross lesions.  No cervical or supraclavicular adenopathy. Thyroid normal.  Lungs:  Clear without wheezing, rales or rhonchi Cardiac: RRR, without RMG Abdominal:  Soft, nontender, without masses, guarding, rebound, organomegaly or hernia Breasts:  Examined lying and sitting without masses, retractions, discharge or axillary adenopathy. Pelvic:  Ext, BUS, Vagina: Normal  Cervix: Normal. Pap smear done  Uterus: Retroverted, normal size, shape and contour, midline and mobile nontender   Adnexa: Without masses or tenderness    Anus and perineum: Normal   Rectovaginal: Normal sphincter tone without palpated masses or tenderness.    Assessment/Plan:  41 y.o. G2P2 female for annual exam with regular menses, vasectomy birth control.   1. Pap smear/HPV 2015. Pap smear done today. Previous Pap smear 2012. No history of significant abnormal Pap smears. Discussed less frequent screening intervals. 2. Mammography 2014. Recommended baseline mammography now she agrees to call and schedule. SBE monthly reviewed. Reviewed ACOG, ACS and [place] health preventative task force recommendations for screening. 3. Health maintenance. Diagnosed with thyroid cancer status post thyroidectomy 2017. Actively being followed for this. Has routine blood work done  elsewhere. Past if I could refill her Qvar and albuterol inhalers that she has been on for years for her asthma which is well controlled. Refill 1 year provided. Follow up in one year, sooner as needed.   Amy Auerbach MD, 8:32 AM 02/07/2017

## 2017-02-07 NOTE — Addendum Note (Signed)
Addended by: Nelva Nay on: 02/07/2017 09:04 AM   Modules accepted: Orders

## 2017-02-07 NOTE — Patient Instructions (Signed)
Schedule your mammogram;

## 2017-02-08 LAB — PAP IG W/ RFLX HPV ASCU

## 2017-02-20 DIAGNOSIS — D225 Melanocytic nevi of trunk: Secondary | ICD-10-CM | POA: Diagnosis not present

## 2017-02-20 DIAGNOSIS — D2261 Melanocytic nevi of right upper limb, including shoulder: Secondary | ICD-10-CM | POA: Diagnosis not present

## 2017-02-20 DIAGNOSIS — D1809 Hemangioma of other sites: Secondary | ICD-10-CM | POA: Diagnosis not present

## 2017-02-20 DIAGNOSIS — L820 Inflamed seborrheic keratosis: Secondary | ICD-10-CM | POA: Diagnosis not present

## 2017-02-20 DIAGNOSIS — D485 Neoplasm of uncertain behavior of skin: Secondary | ICD-10-CM | POA: Diagnosis not present

## 2017-02-20 DIAGNOSIS — D2262 Melanocytic nevi of left upper limb, including shoulder: Secondary | ICD-10-CM | POA: Diagnosis not present

## 2017-03-06 DIAGNOSIS — F411 Generalized anxiety disorder: Secondary | ICD-10-CM | POA: Diagnosis not present

## 2017-04-29 DIAGNOSIS — E063 Autoimmune thyroiditis: Secondary | ICD-10-CM | POA: Diagnosis not present

## 2017-04-29 DIAGNOSIS — D649 Anemia, unspecified: Secondary | ICD-10-CM | POA: Diagnosis not present

## 2017-04-29 DIAGNOSIS — E890 Postprocedural hypothyroidism: Secondary | ICD-10-CM | POA: Diagnosis not present

## 2017-04-29 DIAGNOSIS — C73 Malignant neoplasm of thyroid gland: Secondary | ICD-10-CM | POA: Diagnosis not present

## 2017-05-06 DIAGNOSIS — E890 Postprocedural hypothyroidism: Secondary | ICD-10-CM | POA: Diagnosis not present

## 2017-05-06 DIAGNOSIS — E063 Autoimmune thyroiditis: Secondary | ICD-10-CM | POA: Diagnosis not present

## 2017-05-06 DIAGNOSIS — D649 Anemia, unspecified: Secondary | ICD-10-CM | POA: Diagnosis not present

## 2017-05-06 DIAGNOSIS — C73 Malignant neoplasm of thyroid gland: Secondary | ICD-10-CM | POA: Diagnosis not present

## 2017-06-05 DIAGNOSIS — L820 Inflamed seborrheic keratosis: Secondary | ICD-10-CM | POA: Diagnosis not present

## 2017-06-05 DIAGNOSIS — L720 Epidermal cyst: Secondary | ICD-10-CM | POA: Diagnosis not present

## 2017-06-19 ENCOUNTER — Ambulatory Visit: Payer: Self-pay

## 2017-08-07 DIAGNOSIS — D649 Anemia, unspecified: Secondary | ICD-10-CM | POA: Diagnosis not present

## 2017-08-07 DIAGNOSIS — E890 Postprocedural hypothyroidism: Secondary | ICD-10-CM | POA: Diagnosis not present

## 2017-08-15 ENCOUNTER — Ambulatory Visit: Payer: BLUE CROSS/BLUE SHIELD | Admitting: Gynecology

## 2017-09-25 DIAGNOSIS — J018 Other acute sinusitis: Secondary | ICD-10-CM | POA: Diagnosis not present

## 2017-09-25 DIAGNOSIS — R05 Cough: Secondary | ICD-10-CM | POA: Diagnosis not present

## 2017-10-23 ENCOUNTER — Ambulatory Visit: Payer: Self-pay

## 2017-11-27 ENCOUNTER — Ambulatory Visit
Admission: RE | Admit: 2017-11-27 | Discharge: 2017-11-27 | Disposition: A | Discharge status: Home / Self Care | Payer: BLUE CROSS/BLUE SHIELD | Source: Ambulatory Visit | Attending: Gynecology | Admitting: Gynecology

## 2017-11-27 DIAGNOSIS — Z1231 Encounter for screening mammogram for malignant neoplasm of breast: Secondary | ICD-10-CM

## 2018-01-06 DIAGNOSIS — D2261 Melanocytic nevi of right upper limb, including shoulder: Secondary | ICD-10-CM | POA: Diagnosis not present

## 2018-01-06 DIAGNOSIS — L918 Other hypertrophic disorders of the skin: Secondary | ICD-10-CM | POA: Diagnosis not present

## 2018-01-06 DIAGNOSIS — D1801 Hemangioma of skin and subcutaneous tissue: Secondary | ICD-10-CM | POA: Diagnosis not present

## 2018-01-06 DIAGNOSIS — L821 Other seborrheic keratosis: Secondary | ICD-10-CM | POA: Diagnosis not present

## 2018-02-03 DIAGNOSIS — E890 Postprocedural hypothyroidism: Secondary | ICD-10-CM | POA: Diagnosis not present

## 2018-02-03 DIAGNOSIS — D649 Anemia, unspecified: Secondary | ICD-10-CM | POA: Diagnosis not present

## 2018-02-07 DIAGNOSIS — C73 Malignant neoplasm of thyroid gland: Secondary | ICD-10-CM | POA: Diagnosis not present

## 2018-02-07 DIAGNOSIS — E063 Autoimmune thyroiditis: Secondary | ICD-10-CM | POA: Diagnosis not present

## 2018-02-07 DIAGNOSIS — E890 Postprocedural hypothyroidism: Secondary | ICD-10-CM | POA: Diagnosis not present

## 2018-02-07 DIAGNOSIS — D649 Anemia, unspecified: Secondary | ICD-10-CM | POA: Diagnosis not present

## 2018-02-10 ENCOUNTER — Encounter: Payer: BLUE CROSS/BLUE SHIELD | Admitting: Gynecology

## 2018-03-03 IMAGING — MG DIGITAL SCREENING BILATERAL MAMMOGRAM WITH TOMO AND CAD
8 series · 8 of 24 positions shown · non-contrast
Comparison: Previous exam(s).

CLINICAL DATA: Screening.

EXAM:
DIGITAL SCREENING BILATERAL MAMMOGRAM WITH TOMO AND CAD

[L MLO synth-2D]
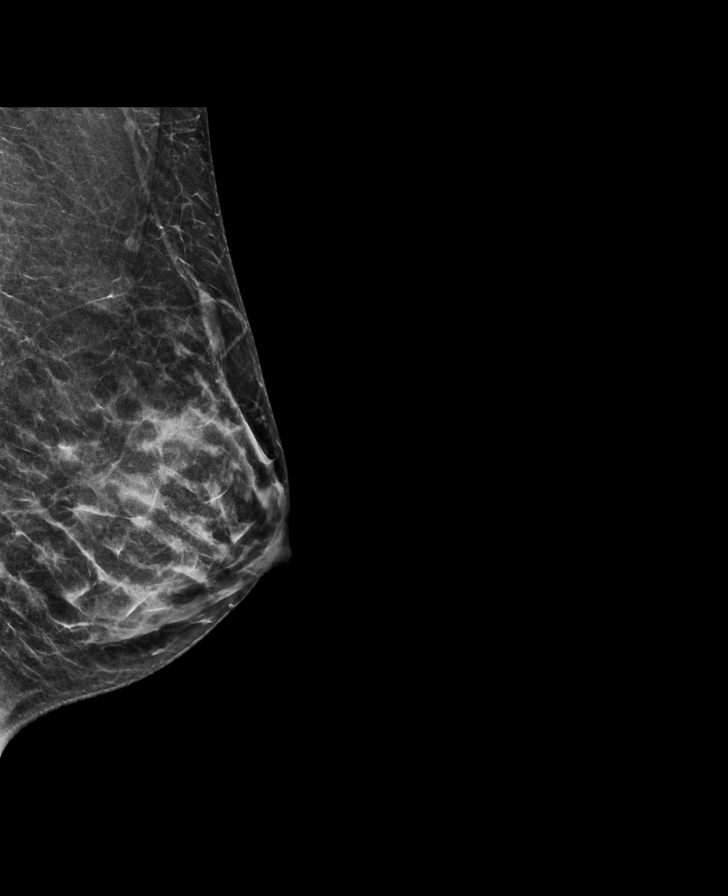

[L CC synth-2D]
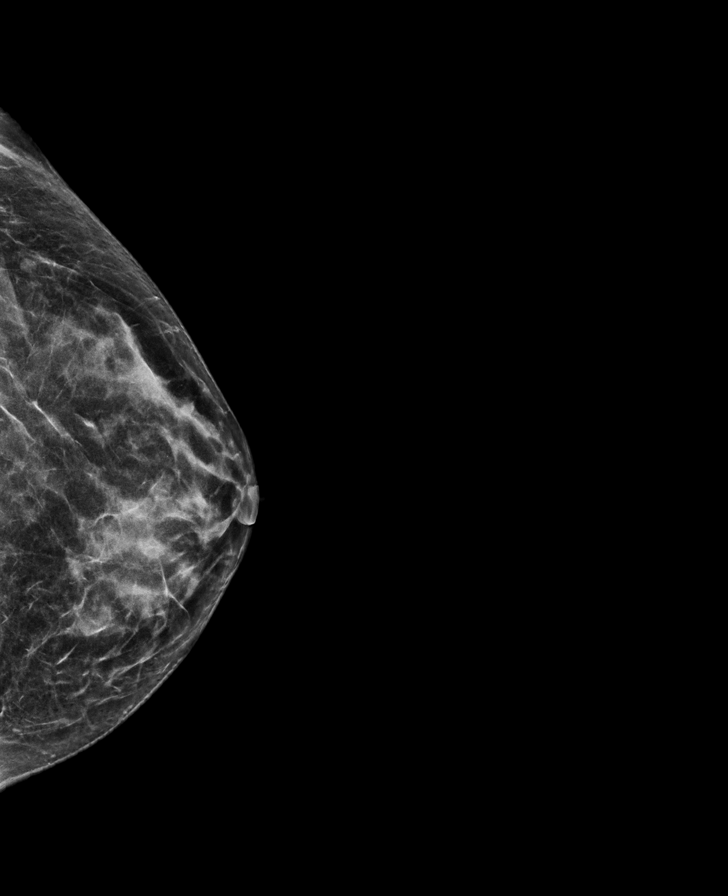

[R MLO synth-2D]
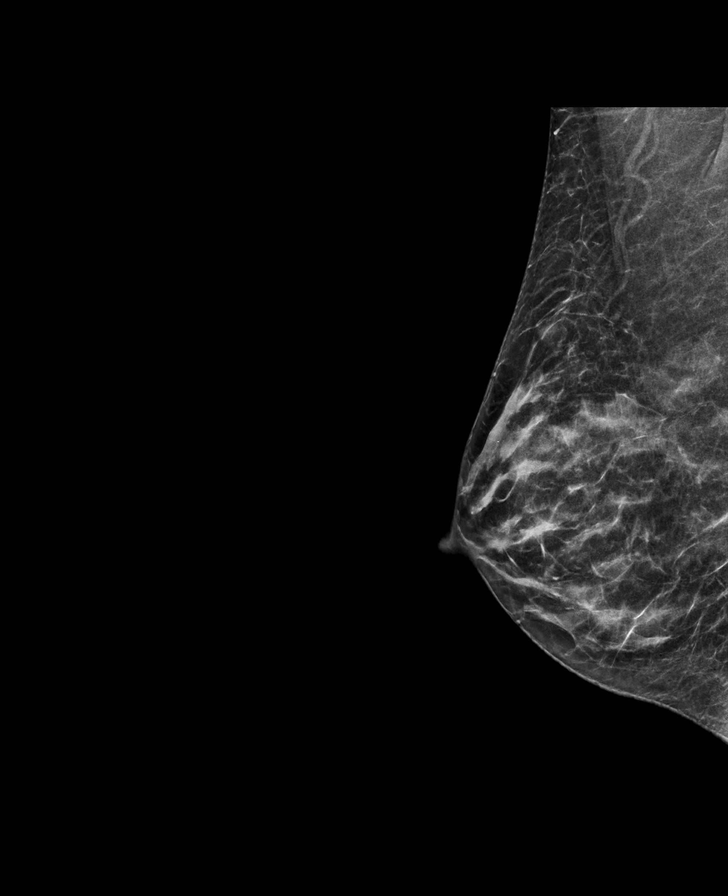

[R CC synth-2D]
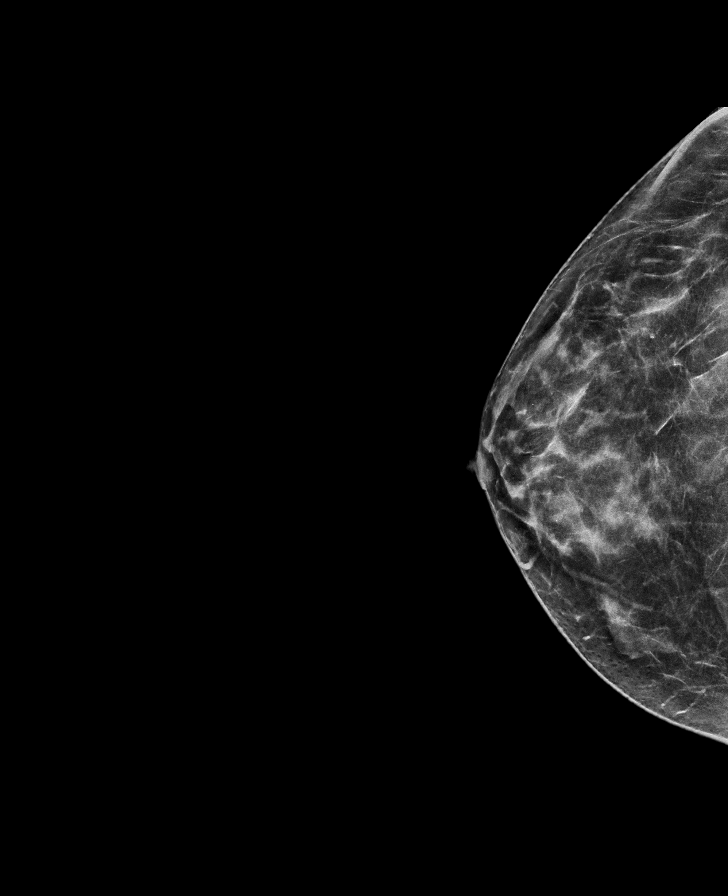

[R MLO tomo · tomo slice 27/54.0]
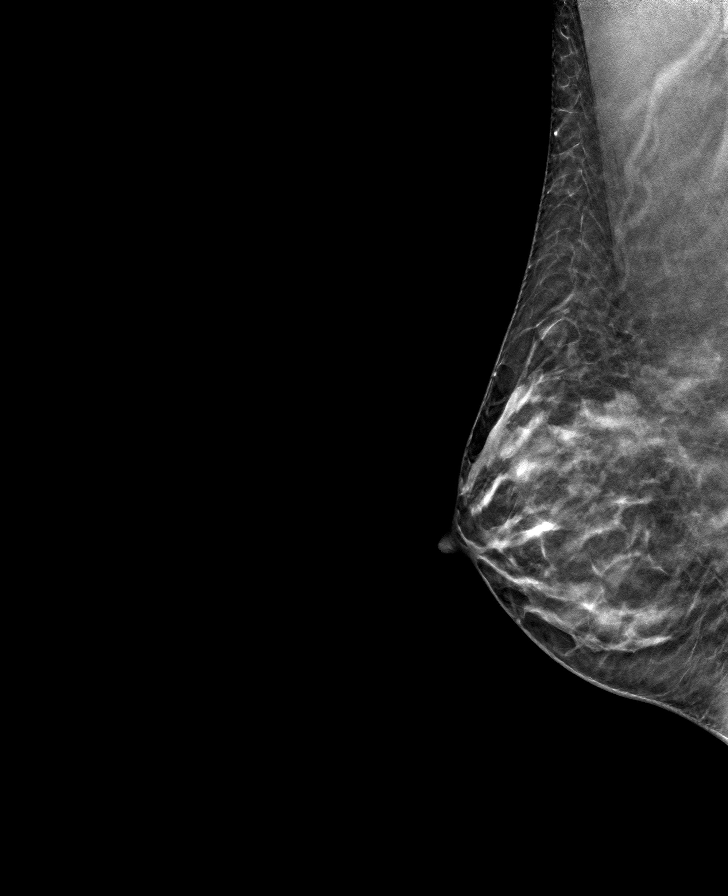

[L MLO tomo · tomo slice 28/55.0]
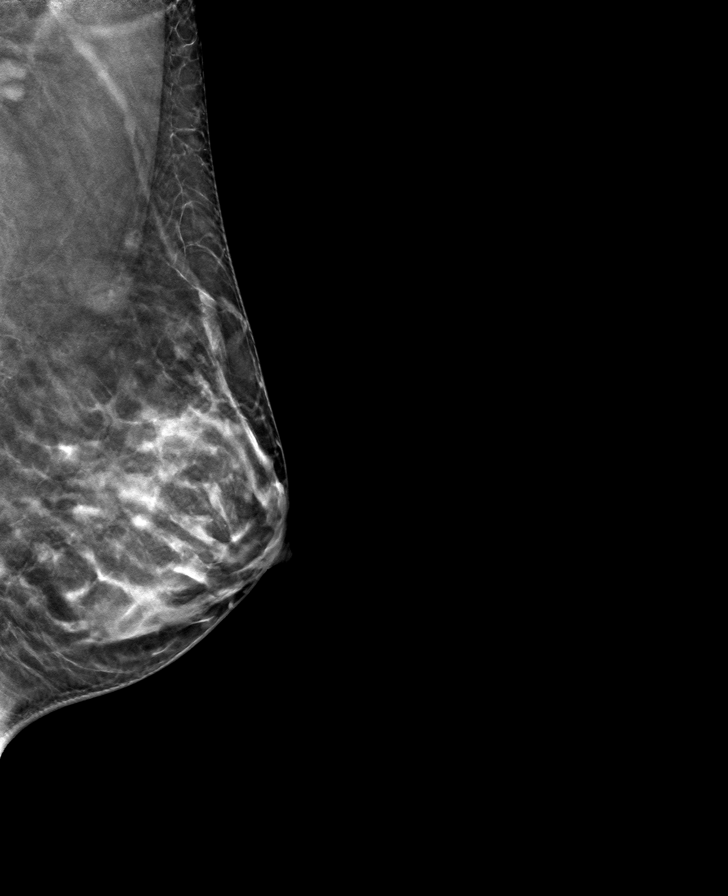

[L CC tomo · tomo slice 29/58.0]
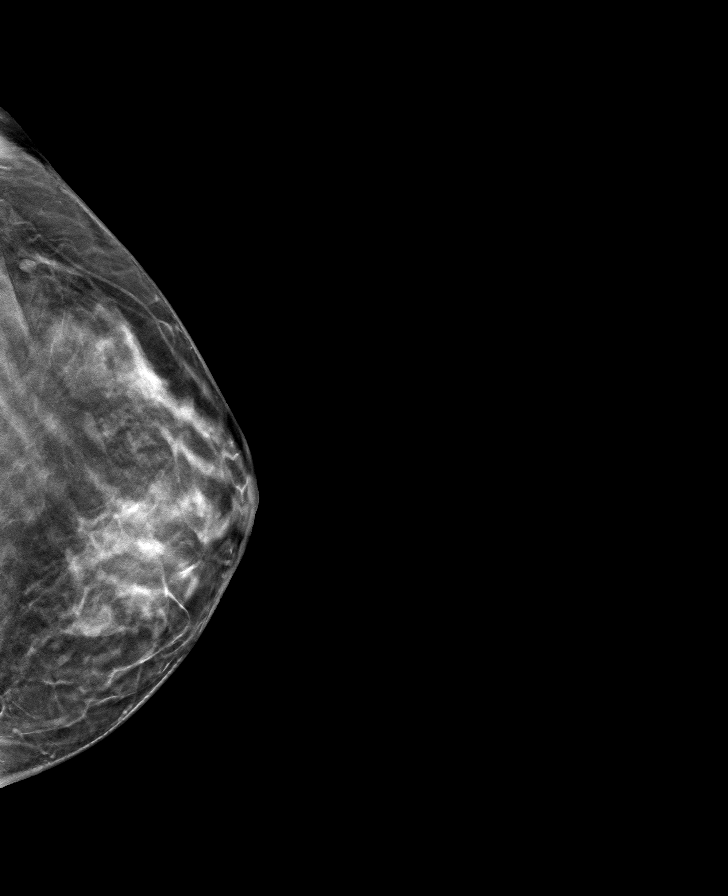

[R CC tomo · tomo slice 29/58.0]
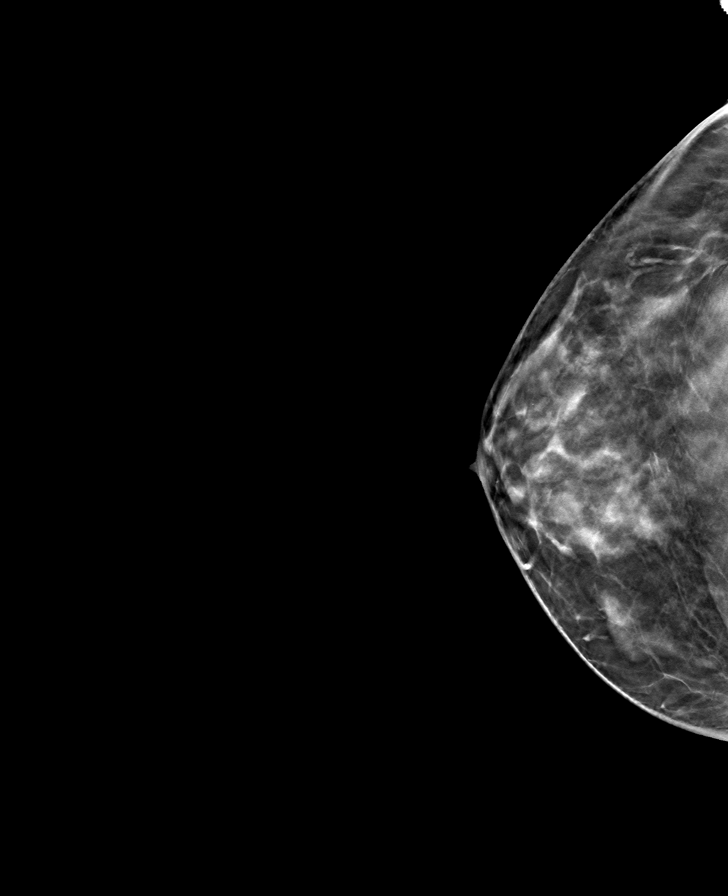

[8 of 24 positions shown; findings below may reference images not displayed]

ACR Breast Density Category c: The breast tissue is heterogeneously
dense, which may obscure small masses.
FINDINGS: There are no findings suspicious for malignancy. Images were
processed with CAD.
IMPRESSION: No mammographic evidence of malignancy. A result letter of this
screening mammogram will be mailed directly to the patient.

RECOMMENDATION:
Screening mammogram in one year. (Code:FT-U-LHB)

BI-RADS CATEGORY  1: Negative.

## 2018-03-11 ENCOUNTER — Ambulatory Visit: Payer: BLUE CROSS/BLUE SHIELD | Admitting: Gynecology

## 2018-03-13 ENCOUNTER — Ambulatory Visit: Payer: BLUE CROSS/BLUE SHIELD | Admitting: Gynecology

## 2018-03-17 DIAGNOSIS — N946 Dysmenorrhea, unspecified: Secondary | ICD-10-CM | POA: Diagnosis not present

## 2018-04-08 ENCOUNTER — Encounter: Payer: Self-pay | Admitting: Gynecology

## 2018-04-08 ENCOUNTER — Ambulatory Visit: Payer: BLUE CROSS/BLUE SHIELD | Admitting: Gynecology

## 2018-04-08 VITALS — BP 120/76 | Ht 70.0 in | Wt 168.0 lb

## 2018-04-08 DIAGNOSIS — N926 Irregular menstruation, unspecified: Secondary | ICD-10-CM | POA: Diagnosis not present

## 2018-04-08 DIAGNOSIS — Z01419 Encounter for gynecological examination (general) (routine) without abnormal findings: Secondary | ICD-10-CM | POA: Diagnosis not present

## 2018-04-08 MED ORDER — LO LOESTRIN FE 1 MG-10 MCG / 10 MCG PO TABS: 1.0000 | ORAL_TABLET | Freq: Every day | ORAL | 12 refills | Status: DC

## 2018-04-08 NOTE — Patient Instructions (Signed)
Start on the new birth control pills as we discussed.  Call me if you have any issues.

## 2018-04-08 NOTE — Progress Notes (Signed)
Amy Bautista 1976/06/10 [id number]        42 y.o.  G2P2 for annual gynecologic exam.  Patient also wants to discuss her menses which tend to be heavier requiring tampons and pad protection during the night, some mild irregularity in her bleeding pattern as well as some PMS type symptoms to include increasing tension and difficulty sleeping before her menses.  Also having issues during ovulation with estrogen spike.  She had used oral contraceptives 20 years ago but could not tolerate them due to her sensitivity to the hormone levels.  She had a consult with a gynecologist/endocrinologist who had discussed the possibility of Mirena IUD for menstrual suppression.  She wanted my input into these suggestions.  Past medical history,surgical history, problem list, medications, allergies, family history and social history were all reviewed and documented as reviewed in the EPIC chart.  ROS:  Performed with pertinent positives and negatives included in the history, assessment and plan.   Additional significant findings : None   Exam: [name] assistant Vitals:   04/08/18 1605  BP: 120/76  Weight: 168 lb (76.2 kg)  Height: 5\' 10"  (1.778 m)   Body mass index is 24.11 kg/m.  General appearance:  Normal affect, orientation and appearance. Skin: Grossly normal HEENT: Without gross lesions.  No cervical or supraclavicular adenopathy. Thyroid normal.  Lungs:  Clear without wheezing, rales or rhonchi Cardiac: RR, without RMG Abdominal:  Soft, nontender, without masses, guarding, rebound, organomegaly or hernia Breasts:  Examined lying and sitting without masses, retractions, discharge or axillary adenopathy. Pelvic:  Ext, BUS, Vagina: Normal  Cervix: Normal  Uterus: Anteverted, normal size, shape and contour, midline and mobile nontender   Adnexa: Without masses or tenderness    Anus and perineum: Normal   Rectovaginal: Normal sphincter tone without palpated masses or tenderness.      Assessment/Plan:  42 y.o. G2P2 female for annual gynecologic exam with mild menstrual irregularity, vasectomy birth control.   1. Menstrual irregularity, heavier menses, PMS type symptoms.  We discussed in detail various options for management to include low-dose oral contraceptives as well as Mirena IUD.  I also discussed with her whether to proceed with evaluation at this point to rule out structural abnormalities such as polyps or myomas as a source of her heavier menses and hormonal studies.  She is having some fluctuation in her thyroid replacement which may account for some of her irregular cycles.  No other symptoms to suggest menopausal changes.  At this point the patient is comfortable not pursuing a further evaluation noting she has some irregularity when her cycle started but no prolonged bleeding or bleeding between her cycles.  Benefits of oral contraceptives to suppress hormonal fluctuations as well as lightening her cycles but risks of not tolerating the hormones given her past history as well as risk of thrombosis such as stroke heart attack DVT.  Benefits of Mirena IUD as far as effective menstrual suppression and not having to remember to take a pill every day versus may not affect hormonal fluctuations and PMS symptoms and the issues of insertion with its inherent risks and discomfort.  After lengthy discussion the patient and I both agreed to proceed with low-dose oral contraceptives and she wants the lowest dose available and we will start with LoLoestrin 1/10.  She will start with an every month withdrawal and then go to an every other to every third month withdrawal at her choice if she tolerates this.  If heavy/irregular menses continue  they may consider evaluation to include sonohysterogram and hormonal studies in the future. 2. Pap smear 02/2017.  No Pap smear done today.  Pap smear/HPV 2015.  No history of significant abnormal Pap smears previously.  We will plan on 3-year repeat  Pap smear per current screening guidelines. 3. Health maintenance.  No routine lab work done as patient does this elsewhere.  Follow-up if issues as far as starting the birth control pills.  Follow-up in 1 year for annual exam.   [name] MD, 5:17 PM 04/08/2018

## 2018-04-09 ENCOUNTER — Encounter: Payer: BLUE CROSS/BLUE SHIELD | Admitting: Gynecology

## 2018-04-09 NOTE — Telephone Encounter (Signed)
2 options:  1.  Try a slightly higher dose pill such as Loestrin 1/20 which has 20 mcg of estrogen which is still considered low dose and there are plenty of generics for this.  If she would like this then call in prescription x1 year 2.  Proceed with Mirena at her choice then I would go ahead and arrange for this.

## 2018-04-22 ENCOUNTER — Other Ambulatory Visit: Payer: Self-pay

## 2018-04-22 MED ORDER — BECLOMETHASONE DIPROPIONATE 80 MCG/ACT IN AERS: 1.0000 | INHALATION_SPRAY | Freq: Every day | RESPIRATORY_TRACT | 4 refills | Status: DC

## 2018-04-28 ENCOUNTER — Encounter: Payer: Self-pay | Admitting: Gynecology

## 2018-04-28 ENCOUNTER — Ambulatory Visit (INDEPENDENT_AMBULATORY_CARE_PROVIDER_SITE_OTHER): Payer: BLUE CROSS/BLUE SHIELD | Admitting: Gynecology

## 2018-04-28 VITALS — BP 118/74

## 2018-04-28 DIAGNOSIS — Z30430 Encounter for insertion of intrauterine contraceptive device: Secondary | ICD-10-CM | POA: Diagnosis not present

## 2018-04-28 HISTORY — PX: INTRAUTERINE DEVICE INSERTION: SHX323

## 2018-04-28 NOTE — Patient Instructions (Signed)

## 2018-04-28 NOTE — Progress Notes (Signed)
    Amy Bautista [place] Aug 22, 1976 [id number]        42 y.o.  G2P2  presents for Mirena IUD placement. She has read through the booklet, has no contraindications and signed the consent form. She currently is at the tail end of a normal menses.  Husband also having vasectomy.  I reviewed the insertional process with her as well as the risks to include infection, either immediate or long-term, uterine perforation or migration requiring surgery to remove, other complications such as pain, hormonal side effects and possibility of failure with subsequent pregnancy.   Exam with [name] assistant Vitals:   04/28/18 1424  BP: 118/74    Pelvic: External BUS vagina normal. Cervix normal. Uterus retroverted normal size shape contour midline mobile nontender. Adnexa without masses or tenderness.  Procedure: The cervix was cleansed with Betadine, anterior lip grasped with a single-tooth tenaculum, the uterus was sounded and a Mirena IUD was placed according to manufacturer's recommendations without difficulty. The strings were trimmed. The patient tolerated well and will follow up in one month for a postinsertional check.  Lot number:  [id number]    [name] MD, 2:51 PM 04/28/2018

## 2018-04-29 ENCOUNTER — Telehealth: Payer: Self-pay | Admitting: *Deleted

## 2018-04-29 NOTE — Telephone Encounter (Signed)
Patient called had Mirena IUD inserted on 04/28/18 having spotting, asked if she can insert tampon, I called her back and left on voicemail recommendation is to wait 24 hours after insertion.

## 2018-05-11 ENCOUNTER — Encounter: Payer: Self-pay | Admitting: Gynecology

## 2018-05-12 NOTE — Telephone Encounter (Signed)
1 to 2 months, so hang in there.

## 2018-05-31 DIAGNOSIS — J020 Streptococcal pharyngitis: Secondary | ICD-10-CM | POA: Diagnosis not present

## 2018-05-31 DIAGNOSIS — J029 Acute pharyngitis, unspecified: Secondary | ICD-10-CM | POA: Diagnosis not present

## 2018-05-31 DIAGNOSIS — M791 Myalgia, unspecified site: Secondary | ICD-10-CM | POA: Diagnosis not present

## 2018-05-31 DIAGNOSIS — T732XXA Exhaustion due to exposure, initial encounter: Secondary | ICD-10-CM | POA: Diagnosis not present

## 2018-06-04 ENCOUNTER — Encounter: Payer: Self-pay | Admitting: Gynecology

## 2018-06-04 ENCOUNTER — Ambulatory Visit: Payer: BLUE CROSS/BLUE SHIELD | Admitting: Gynecology

## 2018-06-04 VITALS — BP 118/78

## 2018-06-04 DIAGNOSIS — Z30431 Encounter for routine checking of intrauterine contraceptive device: Secondary | ICD-10-CM

## 2018-06-04 NOTE — Patient Instructions (Signed)
Follow up for annual exam when due

## 2018-06-04 NOTE — Progress Notes (Signed)
    Amy Bautista [place] [birth year] [id number]        42 y.o.  G2P2 presents for follow-up IUD check.  Overall doing well.  Had a little bit of irregular bleeding and longer menses with her first follow-up menses.  Husband had felt the string on one episode.  Past medical history,surgical history, problem list, medications, allergies, family history and social history were all reviewed and documented in the EPIC chart.  Directed ROS with pertinent positives and negatives documented in the history of present illness/assessment and plan.  Exam: [name] assistant Vitals:   06/04/18 0817  BP: 118/78   General appearance:  Normal Abdomen soft nontender without masses guarding rebound Pelvic external BUS vagina normal.  Cervix normal.  IUD string visualized and appropriate length.  Uterus retroverted normal size midline mobile nontender.  Adnexa without masses or tenderness.  Assessment/Plan:  42 y.o. G2P2 normal IUD follow-up exam.  Overall doing well.  Will keep menstrual calendar and as long as acceptable will monitor.  If husband continues to feel the string she will return to have it trimmed.  Otherwise she will follow-up in 1 year for her annual exam when due    [name] MD, 8:39 AM 06/04/2018

## 2018-06-11 DIAGNOSIS — K0253 Dental caries on pit and fissure surface penetrating into pulp: Secondary | ICD-10-CM | POA: Diagnosis not present

## 2018-07-18 ENCOUNTER — Other Ambulatory Visit: Payer: Self-pay | Admitting: Gynecology

## 2018-07-19 ENCOUNTER — Encounter: Payer: Self-pay | Admitting: Gynecology

## 2018-07-22 ENCOUNTER — Ambulatory Visit (INDEPENDENT_AMBULATORY_CARE_PROVIDER_SITE_OTHER): Payer: BLUE CROSS/BLUE SHIELD | Admitting: Gynecology

## 2018-07-22 ENCOUNTER — Encounter: Payer: Self-pay | Admitting: Gynecology

## 2018-07-22 VITALS — BP 118/76

## 2018-07-22 DIAGNOSIS — Z30431 Encounter for routine checking of intrauterine contraceptive device: Secondary | ICD-10-CM

## 2018-07-22 DIAGNOSIS — N898 Other specified noninflammatory disorders of vagina: Secondary | ICD-10-CM | POA: Diagnosis not present

## 2018-07-22 MED ORDER — FLUCONAZOLE 150 MG PO TABS: 150.0000 mg | ORAL_TABLET | Freq: Once | ORAL | 0 refills | Status: AC

## 2018-07-22 NOTE — Progress Notes (Signed)
    Amy Bautista 1975/12/05 [id number]        42 y.o.  G2P2 presents having done self exams for her IUD string feeling several bumps on the cervix in the 3 and 9 o'clock position.  She was not sure if this was normal and wanted to be checked.  Also notes a little bit of vaginal itching.  No significant discharge, odor or UTI symptoms.  Past medical history,surgical history, problem list, medications, allergies, family history and social history were all reviewed and documented in the EPIC chart.  Directed ROS with pertinent positives and negatives documented in the history of present illness/assessment and plan.  Exam: Amy Bautista assistant Vitals:   07/22/18 0757  BP: 118/76   General appearance:  Normal Abdomen soft nontender without masses guarding rebound Pelvic external BUS vagina normal.  Cervix normal.  IUD string visualized.  No visual or palpable abnormalities of the cervix or upper vaginal fornices.  Uterus normal size midline mobile nontender.  Adnexa without masses or tenderness.  Assessment/Plan:  41 y.o. G2P2 with normal pelvic exam.  No visual or palpable abnormalities.  Patient reassured.  Is having a little itching without other symptoms.  Will cover with Diflucan 150 mg tablet x1.  Follow-up if her symptoms persist.  Follow-up when due for annual exam.    [name] MD, 8:20 AM 07/22/2018

## 2018-07-22 NOTE — Patient Instructions (Signed)
Take the one Diflucan pill.  Follow-up if the vaginal itching continues.

## 2018-08-11 DIAGNOSIS — E890 Postprocedural hypothyroidism: Secondary | ICD-10-CM | POA: Diagnosis not present

## 2018-08-11 DIAGNOSIS — D649 Anemia, unspecified: Secondary | ICD-10-CM | POA: Diagnosis not present

## 2018-08-11 DIAGNOSIS — E559 Vitamin D deficiency, unspecified: Secondary | ICD-10-CM | POA: Diagnosis not present

## 2018-10-16 ENCOUNTER — Ambulatory Visit: Payer: Self-pay

## 2018-10-16 ENCOUNTER — Ambulatory Visit: Payer: BLUE CROSS/BLUE SHIELD | Admitting: Sports Medicine

## 2018-10-16 ENCOUNTER — Ambulatory Visit (INDEPENDENT_AMBULATORY_CARE_PROVIDER_SITE_OTHER): Payer: BLUE CROSS/BLUE SHIELD

## 2018-10-16 ENCOUNTER — Encounter: Payer: Self-pay | Admitting: Sports Medicine

## 2018-10-16 ENCOUNTER — Ambulatory Visit: Payer: Self-pay | Admitting: Sports Medicine

## 2018-10-16 VITALS — BP 138/62 | HR 83 | Ht 70.0 in | Wt 168.0 lb

## 2018-10-16 DIAGNOSIS — M79672 Pain in left foot: Secondary | ICD-10-CM

## 2018-10-16 DIAGNOSIS — G5762 Lesion of plantar nerve, left lower limb: Secondary | ICD-10-CM

## 2018-10-16 DIAGNOSIS — M216X2 Other acquired deformities of left foot: Secondary | ICD-10-CM

## 2018-10-16 NOTE — Progress Notes (Signed)
[name], [name] at [facility] [phone]  Amy Bautista - 43 y.o. female MRN [id number]  Date of birth: 25-Jun-1976  Visit Date:   PCP: Cassandria Anger, MD   Referred by: Cassandria Anger, MD   SUBJECTIVE:  Chief Complaint  Patient presents with  . New Patient (Initial Visit)    L foot pain    HPI: Patient presents today for several months of worsening left midfoot pain.  It is worse with barefoot walking and increased activities.  She feels as though this began 4 to 5 months ago after starting in [facility] and hit training program.  No acute injury but she has tried relative rest as well as absent rest with only normal improvements.  She has used a postoperative shoe with minimal improvements and actually slight worsening of ankle pain.  She has tried manual therapies as well as occasional anti-inflammatories without significant relief.  REVIEW OF SYSTEMS: Denies night time disturbances. Denies fevers, chills, or night sweats. Denies unexplained weight loss. Denies personal history of cancer. Denies changes in bowel or bladder habits. Denies recent unreported falls. Reports,  chronic dyspnea or wheezing. Denies headaches or dizziness.  Denies numbness, tingling or weakness  In the extremities.  Denies dizziness or presyncopal episodes Denies lower extremity edema   HISTORY:  Prior history reviewed and updated per electronic medical record.  Social History   Occupational History  . Occupation: Interior and spatial designer: SELF  Tobacco Use  . Smoking status: Never Smoker  . Smokeless tobacco: Never Used  Substance and Sexual Activity  . Alcohol use: No  . Drug use: No  . Sexual activity: Yes    Partners: Male    Birth control/protection: I.U.D.    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 No h/o abuse.    Past Medical History:  Diagnosis Date  . Allergy   . Anxiety   . Asthma   . Cancer ([place])    thyroid  . Dysplastic nevi    excised lesions: Left UE, Right LE  . Pneumonia    hx  . Thyroid mass    Past Surgical History:  Procedure Laterality Date  . G2P2    . INTRAUTERINE DEVICE INSERTION  04/28/2018   Mirena  . KNEE SURGERY     Arthroscopic  . orthodontic    . THYROIDECTOMY N/A 12/01/2015   Procedure: TOTAL THYROIDECTOMY;  Surgeon: Armandina Gemma, MD;  Location: [facility];  Service: General;  Laterality: N/A;   family history includes Breast cancer in her maternal grandmother; Cancer in her father and paternal grandmother; Heart disease in her paternal grandfather; Hypertension in her brother and paternal grandfather. There is no history of Thyroid disease.  DATA OBTAINED & REVIEWED:  No results for input(s): HGBA1C, LABURIC, CREATINE, CALCIUM, AST, ALT, TSH in the last 8760 hours.  Invalid input(s): MAGNESIUM, CK No problems updated. No specialty comments available.  OBJECTIVE:  VS:  HT:5\' 10"  (177.8 cm)   WT:168 lb (76.2 kg)  BMI:24.11    BP:138/62  HR:83bpm  TEMP: ( )  RESP:99 %   PHYSICAL EXAM: CONSTITUTIONAL: Well-developed, Well-nourished and In no acute distress PSYCHIATRIC : Alert & appropriately interactive. and Not depressed or anxious appearing. RESPIRATORY : No increased work of breathing  and Trachea Midline EYES : Pupils are equal., EOM intact without nystagmus. and No scleral icterus.  VASCULAR EXAM : Warm and well perfused NEURO: unremarkable  MSK Exam:  Left foot  Well aligned No significant deformity. No overlying skin changes TTP over: Third and fourth intermetatarsal space more focally over the third the proximal third of the metatarsals.  She has pain with metatarsal squeeze test.  Early splay toe of the lateral column with  bunionette formation that is worse on the left than on the right.  She has moderately high arch at baseline.  Good subtalar motion as well as normal plantarflexion and dorsiflexion range of motion and strength.  With palpation she does have a small amount of radiating pain into the third and fourth toes.  Negative Mulder's test. No swelling No effusion     X-rays reviewed today that show no evidence of acute stress fracture.  Nondiagnostic x-rays  ASSESSMENT  1. Left foot pain   2. Morton's neuralgia, left   3. Loss of transverse plantar arch of left foot     PLAN:  Pertinent additional documentation may be included in corresponding procedure notes, imaging studies, problem based documentation and patient instructions.  Procedures:  US Guided Injection per procedure note  Medications:  No orders of the defined types were placed in this encounter.   Discussion/Instructions: No problem-specific Assessment & Plan notes found for this encounter.   She does have some transverse arch collapse and there was fusiform swelling on the ultrasound consistent with Morton's neuralgia of the third intermetatarsal space.  Longitudinal transverse arch pad added to her shoes today.  Recommend continue cushioning.  Cool water soaking and slow return activities over the next several weeks.  She may be a candidate function insoles if any lack of improvement, sequela often rated next your foot activities.  Return in about 6 weeks (around 11/27/2018).          Gerda Diss, [facility] Physician

## 2018-10-16 NOTE — Procedures (Signed)
PROCEDURE NOTE:  Ultrasound Guided: Injection: Left Third intermetatarsal space. Images were obtained and interpreted by myself, Teresa Coombs, DO  Images have been saved and stored to PACS system. Images obtained on: GE S7 Ultrasound machine    ULTRASOUND FINDINGS:  She has a small amount of fusiform swelling of vascular bundle at the area of most focal tenderness.  DESCRIPTION OF PROCEDURE:  The patient's clinical condition is marked by substantial pain and/or significant functional disability. Other conservative therapy has not provided relief, is contraindicated, or not appropriate. There is a reasonable likelihood that injection will significantly improve the patient's pain and/or functional impairment.   After discussing the risks, benefits and expected outcomes of the injection and all questions were reviewed and answered, the patient wished to undergo the above named procedure.  Verbal consent was obtained.  The ultrasound was used to identify the target structure and adjacent neurovascular structures. The skin was then prepped in sterile fashion and the target structure was injected under direct visualization using sterile technique as below:  Single injection performed as below: PREP: Alcohol and Ethel Chloride APPROACH:direct, single injection, 25g 1.5 in. INJECTATE: 0.5 cc 0.5% Marcaine and 0.5 cc 40mg /mL DepoMedrol ASPIRATE: None DRESSING: Band-Aid  Post procedural instructions including recommending icing and warning signs for infection were reviewed.    This procedure was well tolerated and there were no complications.   IMPRESSION: Succesful Ultrasound Guided: Injection

## 2018-10-16 NOTE — Patient Instructions (Signed)

## 2018-11-27 ENCOUNTER — Encounter: Payer: Self-pay | Admitting: Sports Medicine

## 2018-11-27 ENCOUNTER — Ambulatory Visit: Payer: BLUE CROSS/BLUE SHIELD | Admitting: Sports Medicine

## 2018-11-27 VITALS — BP 110/80 | HR 87 | Ht 70.0 in

## 2018-11-27 DIAGNOSIS — M79622 Pain in left upper arm: Secondary | ICD-10-CM | POA: Diagnosis not present

## 2018-11-27 DIAGNOSIS — G5762 Lesion of plantar nerve, left lower limb: Secondary | ICD-10-CM

## 2018-11-27 DIAGNOSIS — M9907 Segmental and somatic dysfunction of upper extremity: Secondary | ICD-10-CM | POA: Diagnosis not present

## 2018-11-27 DIAGNOSIS — M79672 Pain in left foot: Secondary | ICD-10-CM

## 2018-11-27 DIAGNOSIS — M9901 Segmental and somatic dysfunction of cervical region: Secondary | ICD-10-CM

## 2018-11-27 DIAGNOSIS — M9902 Segmental and somatic dysfunction of thoracic region: Secondary | ICD-10-CM

## 2018-11-27 NOTE — Patient Instructions (Signed)
The cost of the pair of custom orthotics is $195.  You can look into having your insurance company cover the cost of these. Some insurance companies cover the cost and other do not.  If they do not you will be responsible for the full cost of the orthotics.  I am happy to do these for you at any time, you just need to let our front office schedulers know you would like an "orthotic appointment."  Please also make sure you bring athletic shoes with you on the day of your orthotic appointment or whatever shoes you plan to wear your orthotics in most frequently.   When you call your insurance company you will need to provide them the CPT code which is L3030 and there are 2 units.  You can call them  and ask if this is covered.

## 2018-11-27 NOTE — Progress Notes (Signed)
Amy Bautista. Amy Bautista, [place] at [facility] [phone]  Amy Bautista - 43 y.o. female MRN [id number]  Date of birth: 08-10-1976  Visit Date: November 27, 2018  PCP: Cassandria Anger, MD   Referred by: Cassandria Anger, MD  SUBJECTIVE:  Chief Complaint  Patient presents with  . Left Foot - Follow-up    XR L foot 10/16/18. Corticosteroid injection 10/16/18.     HPI: 6-week follow-up after Morton's neuroma injection at last visit.  She has had almost 70% improvement of the left foot pain.  She did not tolerate the metatarsal pad.  No nighttime awakenings due to this issue.  She has returned to some activities however has not returned to running on a regular basis.  Unfortunately she has had a slight exacerbation of left upper arm and shoulder pain.  Is directly over the triceps insertion at the elbow that is most painful for her.  She has had some dry needling in physical therapy as well as massage without significant improvements.  REVIEW OF SYSTEMS: No significant nighttime awakenings due to this issue. Denies fevers, chills, recent weight gain or weight loss.  No night sweats.  Pt denies any change in bowel or bladder habits, muscle weakness, numbness or falls associated with this pain.  HISTORY:  Prior history reviewed and updated per electronic medical record.  Patient Active Problem List   Diagnosis Date Noted  . 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 Sinus pressure 05/09/2015  . 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 Generalized anxiety disorder 03/31/2015    Situational sx's Xanax prn  Potential benefits of a prn benzodiazepines  use as well as potential risks  and complications were explained to the patient and were aknowledged.    . Abnormal TSH 03/30/2015    03/2015 ?early hypothyroidism   . Well adult exam 03/23/2015    Chronic    . Vitamin D deficiency 03/23/2015    6/16   . Painful tonsil 07/14/2013    10/14 L tonsil irritation (possible URI and a recent scratch from popcorn)   . Right knee pain 02/11/2013    Retropatellar OCD lesion caused mechanical symptoms X1  02/11/13   . Routine health maintenance 12/25/2012    Immunization - Tdap March '14   . PMS (premenstrual syndrome) 07/07/2012    Has variable symptoms - often with irritability.    . ALLERGIC RHINITIS 11/04/2007    Qualifier: Diagnosis of  By: Marca Ancona RMA, Lucy  Life long problem   . Asthma 11/04/2007    Qualifier: Diagnosis of  By: Larose Kells      Social History   Occupational History  . Occupation: Interior and spatial designer: SELF  Tobacco Use  . Smoking status: Never Smoker  . Smokeless tobacco: Never Used  Substance and Sexual Activity  . Alcohol use: No  . Drug use: No  . Sexual activity: Yes    Partners: Male    Birth control/protection: I.U.D.    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 No h/o abuse.    OBJECTIVE:  VS:  HT:5\' 10"  (177.8 cm)   WT:(Pt declined)  BMI:     BP:110/80  HR:87bpm  TEMP: ( )  RESP:99 %   PHYSICAL EXAM: Adult female.  No acute distress.  Alert and appropriate. Patient walks with a normal gait. She has a protracted left shoulder with marked tightness within the pectoralis muscles and trigger points within the serratus anterior and teres minor.  There is tenderness to the deep triceps muscle as well as fasciculations with soft tissue release.  Upper extremity strength is normal..   ASSESSMENT:   1. Left foot pain   2. Morton's neuralgia, left   3. Left upper arm pain   4. Somatic dysfunction of upper extremity   5. Somatic dysfunction of cervical region   6. Somatic dysfunction of thoracic region     PROCEDURES:  PROCEDURE NOTE:  OSTEOPATHIC MANIPULATION  The decision today to treat with Osteopathic Manipulative Therapy (OMT) was based on physical exam findings. Verbal consent was obtained following a discussion with the patient regarding the of risks, benefits and potential side effects, including an acute pain flare,post manipulation soreness and need for repeat treatments. Additionally, we specifically discussed the minimal risk of  injury to neurovascular structures associated with Cervical manipulation. 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  Protracted left shoulder shoulder. Left pec minor tenderpoint Left trapezius tenderpoint  OMT Techniques Used: HVLA muscle energy myofascial release  The patient tolerated the treatment well and reported Improved symptoms following treatment today. Patient was given medications, exercises, stretches and lifestyle modifications per AVS and verbally.    PLAN:  Pertinent additional documentation may be included in corresponding procedure notes, imaging studies, problem based documentation and patient instructions.  No problem-specific Assessment & Plan notes found for this encounter.   Overall she responded well to osteopathic manipulation.  She should continue with therapeutic exercises and slow return to activities.  For her underlying foot issues transverse arch support will be critical for her to be able to maintain the activity level that she would like.  Unfortunately the small metatarsal pad was not tolerated.  We discussed the options for custom cushioned insoles and she will talk to her husband and/or contemplate [place].  Information for this provided.  Follow-up for procedure only  visit if she elects with [place] otherwise as needed.  Continue previously prescribed home exercise program.   Osteopathic manipulation was performed today based on physical exam findings.  Patient was counseled on the purpose and expected outcome of osteopathic manipulation and understands that a single treatment may not provide permanent long lasting relief.  They understand that home therapeutic exercises are critical part of the healing/treatment process and will continue with self treatment between now and their next visit as outlined.  The patient understands that the frequency of visits is meant to provide a stimulus to promote the body's own ability to heal and is not meant to be the sole means for improvement in their symptoms.  Activity modifications and the importance of avoiding exacerbating activities (limiting pain to no more than a 4 / 10 during or following activity) recommended and discussed.  Discussed red flag symptoms that warrant earlier emergent evaluation and patient voices understanding.   No orders of the defined types were placed in this encounter.  Lab Orders  No laboratory test(s) ordered today   Imaging Orders  No imaging studies ordered today   Referral Orders  No referral(s) requested today  Return if symptoms worsen or fail to improve.  She will call for custom cushioned insoles of she elects for this.         Gerda Diss, [facility] Physician

## 2018-12-17 ENCOUNTER — Encounter: Payer: Self-pay | Admitting: Gynecology

## 2019-02-12 ENCOUNTER — Encounter: Payer: BLUE CROSS/BLUE SHIELD | Admitting: Gynecology

## 2019-04-01 ENCOUNTER — Other Ambulatory Visit: Payer: Self-pay | Admitting: Gynecology

## 2019-04-01 NOTE — Telephone Encounter (Signed)
I asked Amy Bautista to give patient a call to schedule CE in July.

## 2019-04-17 DIAGNOSIS — E890 Postprocedural hypothyroidism: Secondary | ICD-10-CM | POA: Diagnosis not present

## 2019-04-17 DIAGNOSIS — D649 Anemia, unspecified: Secondary | ICD-10-CM | POA: Diagnosis not present

## 2019-04-17 DIAGNOSIS — C73 Malignant neoplasm of thyroid gland: Secondary | ICD-10-CM | POA: Diagnosis not present

## 2019-04-17 DIAGNOSIS — E063 Autoimmune thyroiditis: Secondary | ICD-10-CM | POA: Diagnosis not present

## 2019-04-23 DIAGNOSIS — E063 Autoimmune thyroiditis: Secondary | ICD-10-CM | POA: Diagnosis not present

## 2019-04-23 DIAGNOSIS — D649 Anemia, unspecified: Secondary | ICD-10-CM | POA: Diagnosis not present

## 2019-04-23 DIAGNOSIS — E890 Postprocedural hypothyroidism: Secondary | ICD-10-CM | POA: Diagnosis not present

## 2019-04-23 DIAGNOSIS — Z85850 Personal history of malignant neoplasm of thyroid: Secondary | ICD-10-CM | POA: Diagnosis not present

## 2019-05-28 ENCOUNTER — Encounter: Payer: BLUE CROSS/BLUE SHIELD | Admitting: Gynecology

## 2019-06-30 ENCOUNTER — Encounter: Payer: Self-pay | Admitting: Gynecology

## 2019-07-09 DIAGNOSIS — F4323 Adjustment disorder with mixed anxiety and depressed mood: Secondary | ICD-10-CM | POA: Diagnosis not present

## 2019-07-22 ENCOUNTER — Encounter: Payer: Self-pay | Admitting: Gynecology

## 2019-07-27 ENCOUNTER — Encounter: Payer: Self-pay | Admitting: Gynecology

## 2019-07-30 DIAGNOSIS — F4323 Adjustment disorder with mixed anxiety and depressed mood: Secondary | ICD-10-CM | POA: Diagnosis not present

## 2019-08-25 ENCOUNTER — Other Ambulatory Visit: Payer: Self-pay | Admitting: Gynecology

## 2019-08-27 DIAGNOSIS — F4323 Adjustment disorder with mixed anxiety and depressed mood: Secondary | ICD-10-CM | POA: Diagnosis not present

## 2019-09-01 ENCOUNTER — Other Ambulatory Visit: Payer: Self-pay

## 2019-09-01 MED ORDER — VENTOLIN HFA 108 (90 BASE) MCG/ACT IN AERS: INHALATION_SPRAY | RESPIRATORY_TRACT | 1 refills | Status: DC

## 2019-09-01 NOTE — Telephone Encounter (Signed)
Past due for CE. Was due in July.

## 2019-09-10 ENCOUNTER — Encounter: Payer: Self-pay | Admitting: Gynecology

## 2019-09-12 ENCOUNTER — Encounter: Payer: Self-pay | Admitting: Gynecology

## 2019-09-14 ENCOUNTER — Telehealth: Payer: Self-pay | Admitting: Internal Medicine

## 2019-09-14 ENCOUNTER — Encounter: Payer: Self-pay | Admitting: Gynecology

## 2019-09-14 ENCOUNTER — Other Ambulatory Visit: Payer: Self-pay

## 2019-09-14 ENCOUNTER — Ambulatory Visit: Payer: BC Managed Care – PPO | Admitting: Gynecology

## 2019-09-14 VITALS — BP 122/78 | Ht 70.0 in | Wt 186.0 lb

## 2019-09-14 DIAGNOSIS — Z23 Encounter for immunization: Secondary | ICD-10-CM | POA: Diagnosis not present

## 2019-09-14 DIAGNOSIS — R002 Palpitations: Secondary | ICD-10-CM | POA: Diagnosis not present

## 2019-09-14 DIAGNOSIS — Z01419 Encounter for gynecological examination (general) (routine) without abnormal findings: Secondary | ICD-10-CM

## 2019-09-14 DIAGNOSIS — Z30432 Encounter for removal of intrauterine contraceptive device: Secondary | ICD-10-CM | POA: Diagnosis not present

## 2019-09-14 NOTE — Telephone Encounter (Signed)
Spoke to [employer] father in Sports coach. Pt having palpitations   Would like to be seen  Please set up appt  With me

## 2019-09-14 NOTE — Telephone Encounter (Signed)
Patient has been scheduled for 09/17/19.

## 2019-09-14 NOTE — Addendum Note (Signed)
Addended by: Nelva Nay on: 09/14/2019 01:54 PM   Modules accepted: Orders

## 2019-09-14 NOTE — Patient Instructions (Signed)
Follow-up with cardiology as we discussed.  Follow-up if any issues after removing the IUD.  Follow-up in 1 year for annual exam.

## 2019-09-14 NOTE — Progress Notes (Signed)
    Amy Bautista 10-Jun-1976 [id number]        43 y.o.  G2P2 for annual gynecologic exam.  Several issues noted below  Past medical history,surgical history, problem list, medications, allergies, family history and social history were all reviewed and documented as reviewed in the EPIC chart.  ROS:  Performed with pertinent positives and negatives included in the history, assessment and plan.   Additional significant findings : None   Exam: [name] assistant Vitals:   09/14/19 1223  BP: 122/78  Weight: 186 lb (84.4 kg)  Height: 5\' 10"  (1.778 m)   Body mass index is 26.69 kg/m.  General appearance:  Normal affect, orientation and appearance. Skin: Grossly normal HEENT: Without gross lesions.  No cervical or supraclavicular adenopathy. Thyroid normal.  Lungs:  Clear without wheezing, rales or rhonchi Cardiac: RR, without RMG Abdominal:  Soft, nontender, without masses, guarding, rebound, organomegaly or hernia Breasts:  Examined lying and sitting without masses, retractions, discharge or axillary adenopathy. Pelvic:  Ext, BUS, Vagina: Normal   Cervix: Normal.  Pap smear done  Uterus: Anteverted, normal size, shape and contour, midline and mobile nontender   Adnexa: Without masses or tenderness    Anus and perineum: Normal   Rectovaginal: Normal sphincter tone without palpated masses or tenderness.   Procedure.  The cervix was visualized with a speculum and the IUD string was grasped with a [place] forcep, the IUD removed, shown to the patient and discarded.  Assessment/Plan:  43 y.o. G2P2 female for annual gynecologic exam.  With regular light menses, Mirena IUD, vasectomy birth control  1. Mirena IUD.  Patient has noticed heart palpitations over the past year or so.  Seem to be getting worse.  Had discussed with her endocrinologist as well as her father-in-law who is a cardiologist.  Not having associated symptoms such as lightheadedness nausea or chest discomfort.   Does not happen frequently.  Cut out caffeine and noticed that they decreased in frequency.  Was wondering whether they were due to the IUD.  We discussed unlikely to be related to the IUD.  Patient also notes some weight gain and wonders whether this is IUD related.  Ultimately as she is using vasectomy birth control would like to have her IUD removed.  Accomplished as above.  Did recommend she follow-up with cardiology for baseline EKG and palpitation evaluation.  Exam today I heard 1 premature contraction during auscultation.  No cardiac irregularity otherwise.  We will keep a menstrual calendar and will follow-up if any issues.  Will check baseline TSH noting she is status post thyroidectomy and comprehensive metabolic panel due to her palpitations. 2. Mammography 2019.  Recommend follow-up mammogram now.  Breast exam normal today. 3. Pap smear 2018.  Pap smear done today.  Pap smear/HPV 2015 negative.  No history of abnormal Pap smears previously. 4. Health maintenance.  No routine lab work done as patient does this elsewhere.  Follow-up in 1 year for GYN exam.  Follow-up sooner if any issues after removing the IUD.   [name] MD, 1:08 PM 09/14/2019

## 2019-09-14 NOTE — Telephone Encounter (Signed)
I doubt related to the Mirena IUD.  I would hate to pull the IUD for that indication at least at this point.  I would recommend follow-up with cardiologist and starting with EKG and possible Holter monitoring

## 2019-09-14 NOTE — Telephone Encounter (Signed)
Left message for patient to call back  

## 2019-09-15 LAB — COMPREHENSIVE METABOLIC PANEL
AG Ratio: 1.8 (calc) (ref 1.0–2.5)
ALT: 12 U/L (ref 6–29)
AST: 19 U/L (ref 10–30)
Albumin: 4.7 g/dL (ref 3.6–5.1)
Alkaline phosphatase (APISO): 41 U/L (ref 31–125)
BUN: 15 mg/dL (ref 7–25)
CO2: 27 mmol/L (ref 20–32)
Calcium: 9 mg/dL (ref 8.6–10.2)
Chloride: 104 mmol/L (ref 98–110)
Creat: 0.82 mg/dL (ref 0.50–1.10)
Globulin: 2.6 g/dL (calc) (ref 1.9–3.7)
Glucose, Bld: 95 mg/dL (ref 65–99)
Potassium: 4.7 mmol/L (ref 3.5–5.3)
Sodium: 140 mmol/L (ref 135–146)
Total Bilirubin: 1.3 mg/dL — ABNORMAL HIGH (ref 0.2–1.2)
Total Protein: 7.3 g/dL (ref 6.1–8.1)

## 2019-09-15 LAB — PAP IG W/ RFLX HPV ASCU

## 2019-09-15 LAB — TSH: TSH: 0.82 mIU/L

## 2019-09-16 ENCOUNTER — Encounter: Payer: Self-pay | Admitting: Gynecology

## 2019-09-17 ENCOUNTER — Telehealth: Payer: Self-pay | Admitting: Radiology

## 2019-09-17 ENCOUNTER — Ambulatory Visit: Payer: BC Managed Care – PPO | Admitting: Internal Medicine

## 2019-09-17 ENCOUNTER — Other Ambulatory Visit: Payer: Self-pay

## 2019-09-17 ENCOUNTER — Encounter: Payer: Self-pay | Admitting: Internal Medicine

## 2019-09-17 VITALS — BP 134/84 | HR 75 | Ht 70.0 in | Wt 186.4 lb

## 2019-09-17 DIAGNOSIS — R002 Palpitations: Secondary | ICD-10-CM | POA: Diagnosis not present

## 2019-09-17 DIAGNOSIS — F4323 Adjustment disorder with mixed anxiety and depressed mood: Secondary | ICD-10-CM | POA: Diagnosis not present

## 2019-09-17 NOTE — Patient Instructions (Signed)
Medication Instructions:  No changes *If you need a refill on your cardiac medications before your next appointment, please call your pharmacy*  Lab Work: none If you have labs (blood work) drawn today and your tests are completely normal, you will receive your results only by: Marland Kitchen MyChart Message (if you have MyChart) OR . A paper copy in the mail If you have any lab test that is abnormal or we need to change your treatment, we will call you to review the results.  Testing/Procedures: Your physician has recommended that you wear a Zio heart monitor. Heart monitors are medical devices that record the heart's electrical activity. Doctors most often use these monitors to diagnose arrhythmias. Arrhythmias are problems with the speed or rhythm of the heartbeat. The monitor is a small, portable device. You can wear one while you do your normal daily activities. This is usually used to diagnose what is causing palpitations/syncope (passing out).    Follow-Up: Follow up with your physician will depend on test results.  Other Instructions none

## 2019-09-17 NOTE — Progress Notes (Signed)
Cardiology Office Note   Date:  09/17/2019   ID:  KINDLE CONEY, DOB [birth year], MRN [id number]  PCP:  Cassandria Anger, MD  Cardiologist:   Dorris Carnes, MD   Pt self referred for palpitations      History of Present Illness: Amy Bautista is a 43 y.o. female with a history of palpitations.  She presents today for first evaluation.  The patient says she has had palpitations for a while.  She describes them as isolated or very short lived.  No sustained arrhythmia.  No associated dizziness or shortness of breath.  They are bothersome at times if she were to run up the stairs and sit down quickly she will notice a flurry which is the longest she has had of skips for a few beats then nothing she attributes possibly some increased 2 episodes since the spring when she had an IUD placed.  She question whether was drinking too much caffeine she cut back on this for couple months now she has been having them though on most days even with cutting back.  She is active, exercising regularly.  Her IUD was removed a few days ago.  She does note when this was placed she had about a 14 pound weight gain without change in eating or exercise.  She is followed by Dr. Elyse Hsu at [facility] endocrinology.  Labs recently showed a TSH well within normal limits.  Noted hemoglobin from the summer was also normal electrolytes have been normal on last check.       Current Meds  Medication Sig  . Cetirizine HCl (ZYRTEC PO) Take 1 tablet by mouth daily.   . Cholecalciferol (VITAMIN D PO) Take by mouth.  . EPIPEN 2-PAK 0.3 MG/0.3ML SOAJ injection inject as directed if needed for SEVERE ALLERGIC REACTION  . fluticasone (FLONASE) 50 MCG/ACT nasal spray Place 1 spray into both nostrils 2 (two) times daily.   . IRON PO Take by mouth.  . levonorgestrel (MIRENA, 52 MG,) 20 MCG/24HR IUD 1 each by Intrauterine route once.  Marland Kitchen levothyroxine (SYNTHROID, LEVOTHROID) 125 MCG tablet Take 125 mcg by  mouth daily before breakfast.  . Multiple Vitamin (MULTIVITAMIN) tablet Take 1 tablet by mouth daily.  Marland Kitchen QVAR REDIHALER 80 MCG/ACT inhaler INHALE 1 PUFF INTO LUNGS DAILY  . VENTOLIN HFA 108 (90 Base) MCG/ACT inhaler INHALE 1 PUFF BY MOUTH EVERY 6 HOURS AS NEEDED     Allergies:   Doxycycline, Fish allergy, Other, Sertraline, Shellfish allergy, and Zoloft [sertraline hcl]   Past Medical History:  Diagnosis Date  . Allergy   . Anxiety   . Asthma   . Cancer ([place])    thyroid  . Dysplastic nevi    excised lesions: Left UE, Right LE  . Pneumonia    hx  . Thyroid mass     Past Surgical History:  Procedure Laterality Date  . G2P2    . INTRAUTERINE DEVICE INSERTION  04/28/2018   Mirena  . KNEE SURGERY     Arthroscopic  . orthodontic    . THYROIDECTOMY N/A 12/01/2015   Procedure: TOTAL THYROIDECTOMY;  Surgeon: Armandina Gemma, MD;  Location: [facility];  Service: General;  Laterality: N/A;     Social History:  The patient  reports that she has never smoked. She has never used smokeless tobacco. She reports that she does not drink alcohol or use drugs.   Family History:  The patient's family history includes Breast cancer in her maternal grandmother; Cancer in  her father and paternal grandmother; Heart disease in her paternal grandfather; Hypertension in her brother and paternal grandfather.    ROS:  Please see the history of present illness. All other systems are reviewed and  Negative to the above problem except as noted.    PHYSICAL EXAM: VS:  BP 134/84   Pulse 75   Ht 5\' 10"  (1.778 m)   Wt 186 lb 6.4 oz (84.6 kg)   LMP 09/03/2019   BMI 26.75 kg/m   GEN: Well nourished, well developed, in no acute distress  HEENT: normal  Neck: no JVD, carotid bruits, or masses Cardiac: RRR; no murmurs, rubs, or gallops,no edema  Respiratory:  clear to auscultation bilaterally, normal work of breathing GI: soft, nontender, nondistended, + BS  No hepatomegaly  MS: no deformity Moving all  extremities   Skin: warm and dry, no rash Neuro:  Strength and sensation are intact Psych: euthymic mood, full affect   EKG:  EKG is ordered today.  SR 75 bpm   Lipid Panel    Component Value Date/Time   CHOL 161 05/13/2015 1517   TRIG 68.0 05/13/2015 1517   HDL 70.20 05/13/2015 1517   CHOLHDL 2 05/13/2015 1517   VLDL 13.6 05/13/2015 1517   LDLCALC 77 05/13/2015 1517      Wt Readings from Last 3 Encounters:  09/17/19 186 lb 6.4 oz (84.6 kg)  09/14/19 186 lb (84.4 kg)  10/16/18 168 lb (76.2 kg)      ASSESSMENT AND PLAN:  1.  Palpitations.  Her episodes sound as if they are probably isolated PVCs, possibly PACs/bursts of PAT. Marland Kitchen  She may have a irritable focus in her myocardium.  The IUD with its hormone release may have potentiated this.  Difficult to say, but now that she  had the IUD removed can follow.  I spent time reviewing the anatomy and physiology of the process.  I would recommend an event  monitor to evaluate symptoms and burden. While wearing, she should challenge herself at times and do things that may exacerbate so we can document.  For now I would stay active stay hydrated.  Otherwise her cardiac exam is normal.  Review of outside labs lipids of 4 years ago LDL was 77 HDL was good.  Follow-up based on results of above.   Current medicines are reviewed at length with the patient today.  The patient does not have concerns regarding medicines.  Signed, Dorris Carnes, MD  09/17/2019 9:11 AM    [address], [place], [place]  [address] Phone: [phone]; Fax: [phone]

## 2019-09-17 NOTE — Telephone Encounter (Signed)
Enrolled patient for a 14 day Zio monitor to be mailed to patients home.  

## 2019-09-18 ENCOUNTER — Encounter: Payer: Self-pay | Admitting: Gynecology

## 2019-09-22 ENCOUNTER — Other Ambulatory Visit (INDEPENDENT_AMBULATORY_CARE_PROVIDER_SITE_OTHER): Payer: BC Managed Care – PPO

## 2019-09-22 DIAGNOSIS — R002 Palpitations: Secondary | ICD-10-CM | POA: Diagnosis not present

## 2019-10-15 DIAGNOSIS — F4323 Adjustment disorder with mixed anxiety and depressed mood: Secondary | ICD-10-CM | POA: Diagnosis not present

## 2019-10-19 DIAGNOSIS — R002 Palpitations: Secondary | ICD-10-CM | POA: Diagnosis not present

## 2019-10-20 ENCOUNTER — Telehealth: Payer: Self-pay | Admitting: Internal Medicine

## 2019-10-20 NOTE — Telephone Encounter (Signed)
Pt calling in to check on her monitor results. I informed her they have not been received yet. She sent her monitor back on 12/29. I advised pt we would call as soon as results have been reviewed.   Pt wanted to make sure that Dr. Harrington Challenger received her mychart message from 10/12/19 stating that she has been feeling much better, experiencing little to no symptoms and has lost around 15 pounds since having her IUD removed.   I assured the pt the mychart message was sent to Dr. Harrington Challenger. She verbalized understanding.

## 2019-10-20 NOTE — Telephone Encounter (Signed)
New Message    Pt is calling and is wondering if her Monitor results are available     Please call

## 2019-10-23 NOTE — Telephone Encounter (Signed)
Fay Records, MD  10/21/2019 1:49 PM EST    Left msgs with pt. (VM/mychart)     Dr. Harrington Challenger has notified patient of results of monitor as noted above.

## 2019-10-27 ENCOUNTER — Ambulatory Visit: Payer: BC Managed Care – PPO | Attending: Internal Medicine

## 2019-10-27 DIAGNOSIS — Z23 Encounter for immunization: Secondary | ICD-10-CM | POA: Insufficient documentation

## 2019-10-27 NOTE — Progress Notes (Signed)
   Covid-19 Vaccination Clinic  Name:  ECHO CHAPARRO    MRN: [id number] DOB: May 24, 1976  10/27/2019  Ms. West-Sandy was observed post Covid-19 immunization for 30 minutes based on pre-vaccination screening without incidence. She was provided with Vaccine Information Sheet and instruction to access the V-Safe system.   Ms. Letta Moynahan was instructed to call 911 with any severe reactions post vaccine: Marland Kitchen Difficulty breathing  . Swelling of your face and throat  . A fast heartbeat  . A bad rash all over your body  . Dizziness and weakness    Immunizations Administered    Name Date Dose VIS Date Route   Pfizer COVID-19 Vaccine 10/27/2019  3:21 PM 0.3 mL 09/18/2019 Intramuscular   Manufacturer: [place]   Lot: [id number]   [place]: [id number]

## 2019-10-28 DIAGNOSIS — E559 Vitamin D deficiency, unspecified: Secondary | ICD-10-CM | POA: Diagnosis not present

## 2019-10-28 DIAGNOSIS — E890 Postprocedural hypothyroidism: Secondary | ICD-10-CM | POA: Diagnosis not present

## 2019-10-28 DIAGNOSIS — E063 Autoimmune thyroiditis: Secondary | ICD-10-CM | POA: Diagnosis not present

## 2019-10-28 DIAGNOSIS — C73 Malignant neoplasm of thyroid gland: Secondary | ICD-10-CM | POA: Diagnosis not present

## 2019-11-18 ENCOUNTER — Ambulatory Visit: Payer: BC Managed Care – PPO | Attending: Internal Medicine

## 2019-11-18 DIAGNOSIS — Z23 Encounter for immunization: Secondary | ICD-10-CM | POA: Insufficient documentation

## 2019-11-18 NOTE — Progress Notes (Signed)
   Covid-19 Vaccination Clinic  Name:  Amy Bautista    MRN: [id number] DOB: July 15, 1976  11/18/2019  [name] was observed post Covid-19 immunization for 63min without incidence. She was provided with Vaccine Information Sheet and instruction to access the V-Safe system.   Amy Bautista was instructed to call 911 with any severe reactions post vaccine: Marland Kitchen Difficulty breathing  . Swelling of your face and throat  . A fast heartbeat  . A bad rash all over your body  . Dizziness and weakness    Immunizations Administered    Name Date Dose VIS Date Route   Pfizer COVID-19 Vaccine 11/18/2019  8:27 AM 0.3 mL 09/18/2019 Intramuscular   Manufacturer: [place]   Lot: [id number]   [place]: [id number]

## 2019-11-19 DIAGNOSIS — F4323 Adjustment disorder with mixed anxiety and depressed mood: Secondary | ICD-10-CM | POA: Diagnosis not present

## 2019-12-17 DIAGNOSIS — F4323 Adjustment disorder with mixed anxiety and depressed mood: Secondary | ICD-10-CM | POA: Diagnosis not present

## 2019-12-21 DIAGNOSIS — L723 Sebaceous cyst: Secondary | ICD-10-CM | POA: Diagnosis not present

## 2019-12-21 DIAGNOSIS — B078 Other viral warts: Secondary | ICD-10-CM | POA: Diagnosis not present

## 2020-01-14 DIAGNOSIS — F4323 Adjustment disorder with mixed anxiety and depressed mood: Secondary | ICD-10-CM | POA: Diagnosis not present

## 2020-02-11 DIAGNOSIS — F4323 Adjustment disorder with mixed anxiety and depressed mood: Secondary | ICD-10-CM | POA: Diagnosis not present

## 2020-05-24 DIAGNOSIS — C73 Malignant neoplasm of thyroid gland: Secondary | ICD-10-CM | POA: Diagnosis not present

## 2020-05-24 DIAGNOSIS — E890 Postprocedural hypothyroidism: Secondary | ICD-10-CM | POA: Diagnosis not present

## 2020-05-24 LAB — TSH: TSH: 2.43 (ref <=5.90)

## 2020-05-26 DIAGNOSIS — F4323 Adjustment disorder with mixed anxiety and depressed mood: Secondary | ICD-10-CM | POA: Diagnosis not present

## 2020-05-27 DIAGNOSIS — E063 Autoimmune thyroiditis: Secondary | ICD-10-CM | POA: Diagnosis not present

## 2020-05-27 DIAGNOSIS — E890 Postprocedural hypothyroidism: Secondary | ICD-10-CM | POA: Diagnosis not present

## 2020-05-27 DIAGNOSIS — Z85850 Personal history of malignant neoplasm of thyroid: Secondary | ICD-10-CM | POA: Diagnosis not present

## 2020-06-06 DIAGNOSIS — R05 Cough: Secondary | ICD-10-CM | POA: Diagnosis not present

## 2020-06-06 DIAGNOSIS — Z20828 Contact with and (suspected) exposure to other viral communicable diseases: Secondary | ICD-10-CM | POA: Diagnosis not present

## 2020-06-07 DIAGNOSIS — Z20828 Contact with and (suspected) exposure to other viral communicable diseases: Secondary | ICD-10-CM | POA: Diagnosis not present

## 2020-06-07 DIAGNOSIS — R05 Cough: Secondary | ICD-10-CM | POA: Diagnosis not present

## 2020-06-14 DIAGNOSIS — K219 Gastro-esophageal reflux disease without esophagitis: Secondary | ICD-10-CM | POA: Diagnosis not present

## 2020-06-14 DIAGNOSIS — R070 Pain in throat: Secondary | ICD-10-CM | POA: Diagnosis not present

## 2020-06-22 DIAGNOSIS — L720 Epidermal cyst: Secondary | ICD-10-CM | POA: Diagnosis not present

## 2020-06-22 DIAGNOSIS — L723 Sebaceous cyst: Secondary | ICD-10-CM | POA: Diagnosis not present

## 2020-06-28 ENCOUNTER — Telehealth: Payer: Self-pay | Admitting: Nurse Practitioner

## 2020-06-29 DIAGNOSIS — F411 Generalized anxiety disorder: Secondary | ICD-10-CM | POA: Diagnosis not present

## 2020-06-30 ENCOUNTER — Telehealth: Payer: Self-pay | Admitting: *Deleted

## 2020-06-30 DIAGNOSIS — F4323 Adjustment disorder with mixed anxiety and depressed mood: Secondary | ICD-10-CM | POA: Diagnosis not present

## 2020-06-30 NOTE — Telephone Encounter (Signed)
Patient called and left message in triage voicemail asking if she could get a Rx for Lexapro, reports she has taken this in the past and it helped her. I called patient back and explained she will need to schedule an office with Dr.Kendall to discuss this, patient verbalized she understood, reports she will call back to schedule.

## 2020-07-01 ENCOUNTER — Encounter: Payer: Self-pay | Admitting: Gastroenterology

## 2020-07-01 ENCOUNTER — Ambulatory Visit: Payer: BC Managed Care – PPO | Admitting: Gastroenterology

## 2020-07-01 ENCOUNTER — Other Ambulatory Visit (INDEPENDENT_AMBULATORY_CARE_PROVIDER_SITE_OTHER): Payer: BC Managed Care – PPO

## 2020-07-01 VITALS — BP 112/72 | HR 106 | Ht 70.0 in | Wt 159.0 lb

## 2020-07-01 DIAGNOSIS — R12 Heartburn: Secondary | ICD-10-CM | POA: Diagnosis not present

## 2020-07-01 DIAGNOSIS — R1310 Dysphagia, unspecified: Secondary | ICD-10-CM

## 2020-07-01 DIAGNOSIS — R1013 Epigastric pain: Secondary | ICD-10-CM

## 2020-07-01 DIAGNOSIS — R634 Abnormal weight loss: Secondary | ICD-10-CM | POA: Diagnosis not present

## 2020-07-01 LAB — HIGH SENSITIVITY CRP: CRP, High Sensitivity: 0.42 mg/L (ref 0.000–5.000)

## 2020-07-01 LAB — COMPREHENSIVE METABOLIC PANEL
ALT: 9 U/L (ref 0–35)
AST: 13 U/L (ref 0–37)
Albumin: 4.9 g/dL (ref 3.5–5.2)
Alkaline Phosphatase: 40 U/L (ref 39–117)
BUN: 11 mg/dL (ref 6–23)
CO2: 28 mEq/L (ref 19–32)
Calcium: 9 mg/dL (ref 8.4–10.5)
Chloride: 104 mEq/L (ref 96–112)
Creatinine, Ser: 0.85 mg/dL (ref 0.40–1.20)
GFR: 72.38 mL/min (ref >60.00)
Glucose, Bld: 93 mg/dL (ref 70–99)
Potassium: 4 mEq/L (ref 3.5–5.1)
Sodium: 141 mEq/L (ref 135–145)
Total Bilirubin: 1 mg/dL (ref 0.2–1.2)
Total Protein: 7.6 g/dL (ref 6.0–8.3)

## 2020-07-01 LAB — CBC
HCT: 38.8 % (ref 36.0–46.0)
Hemoglobin: 13.1 g/dL (ref 12.0–15.0)
MCHC: 33.7 g/dL (ref 30.0–36.0)
MCV: 91.9 fl (ref 78.0–100.0)
Platelets: 295 10*3/uL (ref 150.0–400.0)
RBC: 4.23 Mil/uL (ref 3.87–5.11)
RDW: 13.1 % (ref 11.5–15.5)
WBC: 6.6 10*3/uL (ref 4.0–10.5)

## 2020-07-01 LAB — SEDIMENTATION RATE: Sed Rate: 4 mm/hr (ref 0–20)

## 2020-07-01 LAB — IGA: IgA: 182 mg/dL (ref 68–378)

## 2020-07-01 LAB — LIPASE: Lipase: 28 U/L (ref 11.0–59.0)

## 2020-07-01 NOTE — Progress Notes (Signed)
[facility] VISIT   Primary Care Provider Plotnikov, Evie Lacks, MD [place] [address] [phone]  Referring Provider Plotnikov, Evie Lacks, MD [address] [place],  [place] [address] [phone]  Patient Profile: Amy Bautista is a 44 y.o. female with a pmh significant for anxiety, asthma, allergies, thyroid cancer (status post thyroidectomy).  The patient presents to the [facility] for an evaluation and management of problem(s) noted below:  Problem List 1. Dysphagia, unspecified type   2. Pyrosis   3. Abdominal pain, epigastric   4. Loss of weight     History of Present Illness This is the patient's first visit to the outpatient [facility] clinic.  For the last month, the patient has been experiencing new symptoms.  Things began a few weeks back when she noticed a question bump or cyst in the back of her throat.  She was getting through a viral upper respiratory infection at the time.  After discussion with her PCP she ended up monitoring her symptoms.  She herself increased her Qvar use up to a few times per day.  As symptoms persisted she went and saw ENT Dr. Benjamine Mola office (states she saw either PA or NP but not Dr. Benjamine Mola).  They noted a possible cyst but did not feel that it had any symptoms.  Over the course of the coming weeks she then began to experience new onset pyrosis.  She described a salivary dysphagia as well.  Discomfort in the subxiphoid region was noted.  As result of her symptoms she began to stop caffeine and coffee intake but saw no difference in her symptoms.  She initiated Nexium daily without significant effect for 2 weeks and after speaking with her uncle in law (Dr. Lyla Son) she increased for the last few days to 20 twice daily but has not noted any significant improvement in her symptoms.  Solid foods are able to pass without problems.  If she drinks liquids she does not feel  significant issues in regards to true dysphagia but has a sensation of uncomfortable there.  She has decreased her eating habits and had an approximately 4 pound weight loss over the course the last 6 weeks as result of dietary changes she may but she also does not want to eat to a sense of fullness because she is concerned about causing worsening symptoms.  She has noted a white tinge to her tongue but she has never been diagnosed with thrush.  She does describe issues of life stressors that are occurring that may be slightly more than normal.  She has reinitiated for the last few days on the Lexapro.  There is no family history of GI malignancies.  Although she does express that her mother and father had histories of ulcer disease as well as her mother having H. pylori.  Patient does not take significant nonsteroidals or BC/Goody powders.  GI Review of Systems Positive as above Negative for odynophagia, nausea, vomiting, early satiety, melena, hematochezia, change in bowel habits  Review of Systems General: Denies fevers/chills HEENT: Denies oral lesions/sore throat Cardiovascular: Denies chest pain/palpitations Pulmonary: Denies shortness of breath/nocturnal cough Gastroenterological: See HPI Genitourinary: Denies darkened urine Hematological: Denies easy bruising/bleeding Dermatological: Denies jaundice Psychological: She is noted some increased life stressors and anxiety which is led her to reinitiate Lexapro recently but otherwise feels well and stable   Medications Current Outpatient Medications  Medication Sig Dispense Refill  . Cetirizine HCl (ZYRTEC PO)  Take 1 tablet by mouth daily.     . Cholecalciferol (VITAMIN D PO) Take by mouth.    . EPIPEN 2-PAK 0.3 MG/0.3ML SOAJ injection inject as directed if needed for SEVERE ALLERGIC REACTION  0  . escitalopram (LEXAPRO) 5 MG tablet Take 5 mg by mouth daily.    Marland Kitchen esomeprazole (NEXIUM) 20 MG capsule Take 20 mg by mouth daily at 12 noon.      . fluticasone (FLONASE) 50 MCG/ACT nasal spray Place 1 spray into both nostrils 2 (two) times daily.     . IRON PO Take by mouth.    . levonorgestrel (MIRENA, 52 MG,) 20 MCG/24HR IUD 1 each by Intrauterine route once.    Marland Kitchen levothyroxine (SYNTHROID, LEVOTHROID) 125 MCG tablet Take 125 mcg by mouth daily before breakfast.    . Multiple Vitamin (MULTIVITAMIN) tablet Take 1 tablet by mouth daily.    Marland Kitchen QVAR REDIHALER 80 MCG/ACT inhaler INHALE 1 PUFF INTO LUNGS DAILY 10.6 g 1  . VENTOLIN HFA 108 (90 Base) MCG/ACT inhaler INHALE 1 PUFF BY MOUTH EVERY 6 HOURS AS NEEDED 18 g 1   No current facility-administered medications for this visit.    Allergies Allergies  Allergen Reactions  . Doxycycline Nausea Only    nausea nausea  . Fish Allergy Itching and Nausea Only  . Other Itching and Nausea Only  . Sertraline Anxiety  . Shellfish Allergy Itching and Nausea Only  . Zoloft [Sertraline Hcl] Anxiety    Histories Past Medical History:  Diagnosis Date  . Allergy   . Anxiety   . Asthma   . Cancer ([place])    thyroid  . Dysplastic nevi    excised lesions: Left UE, Right LE  . Pneumonia    hx  . Thyroid mass    Past Surgical History:  Procedure Laterality Date  . G2P2    . INTRAUTERINE DEVICE INSERTION  04/28/2018   Mirena  . KNEE SURGERY     Arthroscopic  . orthodontic    . THYROIDECTOMY N/A 12/01/2015   Procedure: TOTAL THYROIDECTOMY;  Surgeon: Armandina Gemma, MD;  Location: [facility];  Service: General;  Laterality: N/A;   Social History   Socioeconomic History  . Marital status: Married    Spouse name: Not on file  . Number of children: 2  . Years of education: 12+  . Highest education level: Not on file  Occupational History  . Occupation: Interior and spatial designer: SELF  Tobacco Use  . Smoking status: Never Smoker  . Smokeless tobacco: Never Used  Vaping Use  . Vaping Use: Never used  Substance and Sexual Activity  . Alcohol use: No  . Drug use: No  . Sexual activity:  Yes    Partners: Male    Birth control/protection: I.U.D.    Comment: Vasectomy-1st intercourse 44 yo-Fewer than 5 partners Mirena 04/28/2018  Other Topics Concern  . 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 No h/o abuse.   Social Determinants of Health   Financial Resource Strain:   . Difficulty of Paying Living Expenses: Not on file  Food Insecurity:   . Worried About Charity fundraiser in the Last Year: Not on file  . Ran Out of Food in the Last Year: Not on file  Transportation Needs:   . Lack of Transportation (Medical): Not on file  . Lack of Transportation (Non-Medical): Not on  file  Physical Activity:   . Days of Exercise per Week: Not on file  . Minutes of Exercise per Session: Not on file  Stress:   . Feeling of Stress : Not on file  Social Connections:   . Frequency of Communication with Friends and Family: Not on file  . Frequency of Social Gatherings with Friends and Family: Not on file  . Attends Religious Services: Not on file  . Active Member of Clubs or Organizations: Not on file  . Attends Archivist Meetings: Not on file  . Marital Status: Not on file  Intimate Partner Violence:   . Fear of Current or Ex-Partner: Not on file  . Emotionally Abused: Not on file  . Physically Abused: Not on file  . Sexually Abused: Not on file   Family History  Problem Relation Age of Onset  . Cancer Father        melanoma  . Peptic Ulcer Disease Father   . Peptic Ulcer Disease Mother   . Hypertension Brother   . Hypertension Paternal Grandfather   . Heart disease Paternal Grandfather   . Cancer Paternal Grandmother        LUNG   . Breast cancer Maternal Grandmother        70's  . Thyroid disease Neg Hx   . Colon cancer Neg Hx   . Stomach cancer Neg Hx   . Esophageal cancer Neg Hx   . Inflammatory bowel disease Neg Hx   . Liver disease Neg Hx   .  Rectal cancer Neg Hx   . Pancreatic cancer Neg Hx    I have reviewed her medical, social, and family history in detail and updated the electronic medical record as necessary.    PHYSICAL EXAMINATION  BP 112/72   Pulse (!) 106   Ht _0  (1.778 m)   Wt 159 lb (72.1 kg)   BMI 22.81 kg/m  Wt Readings from Last 3 Encounters:  07/01/20 159 lb (72.1 kg)  09/17/19 186 lb 6.4 oz (84.6 kg)  09/14/19 186 lb (84.4 kg)  GEN: NAD, appears stated age, doesn't appear chronically ill PSYCH: Cooperative, without pressured speech EYE: Conjunctivae pink, sclerae anicteric ENT: MMM, without oral ulcers, no erythema or exudates noted with no evidence of thrush NECK: Supple CV: Nontachycardic RESP: No audible wheezing GI: NABS, soft, minimal tenderness to palpation in the subxiphoid and right upper quadrant regions, NT everywhere else, ND, without rebound or guarding, no HSM appreciated MSK/EXT: No lower extremity edema SKIN: No jaundice NEURO:  Alert & Oriented x 3, no focal deficits   REVIEW OF DATA  I reviewed the following data at the time of this encounter:  GI Procedures and Studies  No relevant studies to review  Laboratory Studies  Reviewed those in epic  Imaging Studies  No relevant studies to review   ASSESSMENT  Ms. Letta Moynahan is a 44 y.o. female with a pmh significant for anxiety, asthma, allergies, thyroid cancer (status post thyroidectomy).  The patient is seen today for evaluation and management of:  1. Dysphagia, unspecified type   2. Pyrosis   3. Abdominal pain, epigastric   4. Loss of weight    The patient is hemodynamically stable.  Clinically however, she has had acute onset of subxiphoid/epigastric discomfort with new onset pyrosis and atypical dysphagia.  Etiology is not completely clear.  There is family history of ulcer disease in both her mother and father.  This does not sound to be typical  biliary colic based on her symptoms currently.  For increased Qvar  use earlier on in her history could have predisposed her to Candida esophagitis although she is not having overt odynophagia and has no evidence of thrush.  Certainly a diagnostic endoscopy is indicated and warranted.  She has had some weight loss although she has really cut some of her foods out.  Interestingly however, when you look at the patient's weight over the course of the last year she has had a 27 pound weight loss which she had not really described.  She remains significantly worried as to her symptoms.  She has not noted any significant improvement with PPI therapy.  Certainly the possibility of underlying anxiety and stressors could lead to some symptoms but certainly we need to further evaluate things.  An abdominal ultrasound will be obtained.  Diagnostic endoscopy will be scheduled as soon as able.  She will continue PPI therapy currently.  We will consider additional work-up and management after this is completed.  Query the use of Carafate.  Laboratory work-up as outlined below will be initiated as well.  The risks and benefits of endoscopic evaluation were discussed with the patient; these include but are not limited to the risk of perforation, infection, bleeding, missed lesions, lack of diagnosis, severe illness requiring hospitalization, as well as anesthesia and sedation related illnesses.  The patient is agreeable to proceed.  All patient questions were answered to the best of my ability, and the patient agrees to the aforementioned plan of action with follow-up as indicated.   PLAN  Laboratories as outlined below Diagnostic endoscopy rescheduled (esophageal/gastric/duodenal biopsies to be obtained Abdominal ultrasound to be obtained Continue current PPI dosing We will need to press and talk with her little bit more about her weight loss because if she is really had this significant amount of weight loss in a year there may be a role for a colonoscopy if her upper endoscopy is  unremarkable   Orders Placed This Encounter  Procedures  . US Abdomen Complete  . CBC  . Comp Met (CMET)  . Lipase  . IgA  . Tissue transglutaminase, IgA  . Sedimentation rate  . CRP High sensitivity  . Ambulatory referral to Gastroenterology    New Prescriptions   No medications on file   Modified Medications   No medications on file    Planned Follow Up No follow-ups on file.   Total Time in Face-to-Face and in Coordination of Care for patient including independent/personal interpretation/review of prior testing, medical history, examination, medication adjustment, communicating results with the patient directly, and documentation with the EHR is 50 minutes.   Justice Britain, MD [facility] Advanced Endoscopy Office # [phone]

## 2020-07-01 NOTE — Patient Instructions (Addendum)
You may decrease your Nexium to 20mg  daily for now.     Our provider has requested that you go to the basement level for lab work before leaving today. Press "B" on the elevator. The lab is located at the first door on the left as you exit the elevator.  You have been scheduled for an abdominal ultrasound at [facility] (1st floor of hospital) on 07/08/20 at 8:00am Please arrive 15 minutes prior to your appointment for registration. Make certain not to have anything to eat or drink 6 hours prior to your appointment. Should you need to reschedule your appointment, please contact radiology at [phone]. This test typically takes about 30 minutes to perform.  Due to recent changes in healthcare laws, you may see the results of your imaging and laboratory studies on MyChart before your provider has had a chance to review them.  We understand that in some cases there may be results that are confusing or concerning to you. Not all laboratory results come back in the same time frame and the provider may be waiting for multiple results in order to interpret others.  Please give [place] 48 hours in order for your provider to thoroughly review all the results before contacting the office for clarification of your results.   You have been scheduled for an endoscopy. Please follow written instructions given to you at your visit today. If you use inhalers (even only as needed), please bring them with you on the day of your procedure.   Thank you for choosing me and [facility].  Dr. Rush Landmark

## 2020-07-03 ENCOUNTER — Encounter: Payer: Self-pay | Admitting: Gastroenterology

## 2020-07-03 DIAGNOSIS — R12 Heartburn: Secondary | ICD-10-CM | POA: Insufficient documentation

## 2020-07-03 DIAGNOSIS — R634 Abnormal weight loss: Secondary | ICD-10-CM | POA: Insufficient documentation

## 2020-07-03 DIAGNOSIS — R1013 Epigastric pain: Secondary | ICD-10-CM | POA: Insufficient documentation

## 2020-07-03 DIAGNOSIS — R1310 Dysphagia, unspecified: Secondary | ICD-10-CM | POA: Insufficient documentation

## 2020-07-04 LAB — TISSUE TRANSGLUTAMINASE, IGA: (tTG) Ab, IgA: <1 U/mL

## 2020-07-06 ENCOUNTER — Ambulatory Visit (HOSPITAL_COMMUNITY): Payer: BC Managed Care – PPO

## 2020-07-07 ENCOUNTER — Encounter: Payer: Self-pay | Admitting: Gastroenterology

## 2020-07-07 ENCOUNTER — Ambulatory Visit (AMBULATORY_SURGERY_CENTER): Payer: BC Managed Care – PPO | Admitting: Gastroenterology

## 2020-07-07 ENCOUNTER — Other Ambulatory Visit: Payer: Self-pay

## 2020-07-07 VITALS — BP 98/56 | HR 64 | Temp 99.6°F | Resp 14 | Ht 70.0 in | Wt 159.0 lb

## 2020-07-07 DIAGNOSIS — Z1211 Encounter for screening for malignant neoplasm of colon: Secondary | ICD-10-CM

## 2020-07-07 DIAGNOSIS — K2100 Gastro-esophageal reflux disease with esophagitis, without bleeding: Secondary | ICD-10-CM | POA: Diagnosis not present

## 2020-07-07 DIAGNOSIS — K319 Disease of stomach and duodenum, unspecified: Secondary | ICD-10-CM | POA: Diagnosis not present

## 2020-07-07 DIAGNOSIS — R1319 Other dysphagia: Secondary | ICD-10-CM

## 2020-07-07 DIAGNOSIS — K2970 Gastritis, unspecified, without bleeding: Secondary | ICD-10-CM | POA: Diagnosis not present

## 2020-07-07 DIAGNOSIS — R1310 Dysphagia, unspecified: Secondary | ICD-10-CM

## 2020-07-07 DIAGNOSIS — K3189 Other diseases of stomach and duodenum: Secondary | ICD-10-CM | POA: Diagnosis not present

## 2020-07-07 MED ORDER — SODIUM CHLORIDE 0.9 % IV SOLN: 500.0000 mL | Freq: Once | INTRAVENOUS | Status: DC

## 2020-07-07 NOTE — Progress Notes (Signed)
Called to room to assist during endoscopic procedure.  Patient ID and intended procedure confirmed with present staff. Received instructions for my participation in the procedure from the performing physician.  

## 2020-07-07 NOTE — Progress Notes (Signed)
C.W. vital signs. 

## 2020-07-07 NOTE — Progress Notes (Signed)
Pt's states no medical or surgical changes since previsit or office visit. 

## 2020-07-07 NOTE — Progress Notes (Signed)
0800 Robinul 0.1 mg IV given due large amount of secretions upon assessment.  MD made aware, vss 

## 2020-07-07 NOTE — Patient Instructions (Signed)
Handouts on gastritis and esophagitis given to you today Await pathology results     YOU HAD AN ENDOSCOPIC PROCEDURE TODAY AT [facility]:   Refer to the procedure report that was given to you for any specific questions about what was found during the examination.  If the procedure report does not answer your questions, please call your gastroenterologist to clarify.  If you requested that your care partner not be given the details of your procedure findings, then the procedure report has been included in a sealed envelope for you to review at your convenience later.  YOU SHOULD EXPECT: Some feelings of bloating in the abdomen. Passage of more gas than usual.  Walking can help get rid of the air that was put into your GI tract during the procedure and reduce the bloating. If you had a lower endoscopy (such as a colonoscopy or flexible sigmoidoscopy) you may notice spotting of blood in your stool or on the toilet paper. If you underwent a bowel prep for your procedure, you may not have a normal bowel movement for a few days.  Please Note:  You might notice some irritation and congestion in your nose or some drainage.  This is from the oxygen used during your procedure.  There is no need for concern and it should clear up in a day or so.  SYMPTOMS TO REPORT IMMEDIATELY:   Following upper endoscopy (EGD)  Vomiting of blood or coffee ground material  New chest pain or pain under the shoulder blades  Painful or persistently difficult swallowing  New shortness of breath  Fever of 100F or higher  Black, tarry-looking stools  For urgent or emergent issues, a gastroenterologist can be reached at any hour by calling [phone]. Do not use MyChart messaging for urgent concerns.    DIET:  We do recommend a small meal at first, but then you may proceed to your regular diet.  Drink plenty of fluids but you should avoid alcoholic beverages for 24 hours.  ACTIVITY:  You should plan  to take it easy for the rest of today and you should NOT DRIVE or use heavy machinery until tomorrow (because of the sedation medicines used during the test).    FOLLOW UP: Our staff will call the number listed on your records 48-72 hours following your procedure to check on you and address any questions or concerns that you may have regarding the information given to you following your procedure. If we do not reach you, we will leave a message.  We will attempt to reach you two times.  During this call, we will ask if you have developed any symptoms of COVID 19. If you develop any symptoms (ie: fever, flu-like symptoms, shortness of breath, cough etc.) before then, please call [phone].  If you test positive for Covid 19 in the 2 weeks post procedure, please call and report this information to [place].    If any biopsies were taken you will be contacted by phone or by letter within the next 1-3 weeks.  Please call us at [phone] if you have not heard about the biopsies in 3 weeks.    SIGNATURES/CONFIDENTIALITY: You and/or your care partner have signed paperwork which will be entered into your electronic medical record.  These signatures attest to the fact that that the information above on your After Visit Summary has been reviewed and is understood.  Full responsibility of the confidentiality of this discharge information lies with you and/or  your care-partner. 

## 2020-07-07 NOTE — Op Note (Signed)
[facility] Patient Name: Amy Bautista Procedure Date: 07/07/2020 7:55 AM MRN: [id number] Endoscopist: Justice Britain , MD Age: 44 Referring MD:  Date of Birth: 1975-12-31 Gender: Female Account #: [id number] Procedure:                Upper GI endoscopy Indications:              Epigastric abdominal pain, Dysphagia, Failure to                            respond to medical treatment Medicines:                Monitored Anesthesia Care Procedure:                Pre-Anesthesia Assessment:                           - Prior to the procedure, a History and Physical                            was performed, and patient medications and                            allergies were reviewed. The patient's tolerance of                            previous anesthesia was also reviewed. The risks                            and benefits of the procedure and the sedation                            options and risks were discussed with the patient.                            All questions were answered, and informed consent                            was obtained. Prior Anticoagulants: The patient has                            taken no previous anticoagulant or antiplatelet                            agents. ASA Grade Assessment: II - A patient with                            mild systemic disease. After reviewing the risks                            and benefits, the patient was deemed in                            satisfactory condition to undergo the procedure.  After obtaining informed consent, the endoscope was                            passed under direct vision. Throughout the                            procedure, the patient's blood pressure, pulse, and                            oxygen saturations were monitored continuously. The                            Endoscope was introduced through the mouth, and                            advanced to the  second part of duodenum. The upper                            GI endoscopy was accomplished without difficulty.                            The patient tolerated the procedure. Scope In: Scope Out: Findings:                 No gross lesions were noted in the entire                            esophagus. Biopsies were taken with a cold forceps                            for histology to rule out EoE/LoE.                           LA Grade A (one or more mucosal breaks less than 5                            mm, not extending between tops of 2 mucosal folds)                            esophagitis was found in the distal esophagus.                           The Z-line was regular and was found 39 cm from the                            incisors.                           Patchy mild inflammation characterized by erosions,                            erythema, granularity and linear erosions was found  in the gastric body, at the incisura and in the                            gastric antrum.                           No other gross lesions were noted in the entire                            examined stomach. Biopsies were taken with a cold                            forceps for histology.                           No gross lesions were noted in the duodenal bulb,                            in the first portion of the duodenum and in the                            second portion of the duodenum. Biopsies were taken                            with a cold forceps for histology to rule out                            enteropathies. Complications:            No immediate complications. Estimated Blood Loss:     Estimated blood loss was minimal. Impression:               - No gross lesions in esophagus. Biopsied for                            EoE/LoE evaluation.                           - LA Grade A esophagitis distally.                           - Z-line regular, 39 cm from  the incisors.                           - Gastritis. No other gross lesions in the stomach.                            Biopsied.                           - No gross lesions in the duodenal bulb, in the                            first portion of the duodenum and in the second  portion of the duodenum. Biopsied. Recommendation:           - The patient will be observed post-procedure,                            until all discharge criteria are met.                           - Discharge patient to home.                           - Patient has a contact number available for                            emergencies. The signs and symptoms of potential                            delayed complications were discussed with the                            patient. Return to normal activities tomorrow.                            Written discharge instructions were provided to the                            patient.                           - Resume previous diet.                           - Would recommend transition of Nexium to a                            different PPI though, she is currently feeling                            somewhat better while off PPI currently. We can                            await biopsies to see how things go, but will                            likely find gastritis and in setting of esophagitis                            and would recommend that we consider PPI therapy                            but can further discuss.                           - The findings and recommendations were discussed  with the patient.                           - The findings and recommendations were discussed                            with the patient's family. Justice Britain, MD 07/07/2020 8:18:59 AM

## 2020-07-07 NOTE — Progress Notes (Signed)
Report given to PACU, vss 

## 2020-07-08 ENCOUNTER — Ambulatory Visit (HOSPITAL_COMMUNITY)
Admission: RE | Admit: 2020-07-08 | Discharge: 2020-07-08 | Disposition: A | Discharge status: Home / Self Care | Payer: BC Managed Care – PPO | Source: Ambulatory Visit | Attending: Gastroenterology | Admitting: Gastroenterology

## 2020-07-08 ENCOUNTER — Ambulatory Visit: Payer: BC Managed Care – PPO | Admitting: Nurse Practitioner

## 2020-07-08 DIAGNOSIS — R1013 Epigastric pain: Secondary | ICD-10-CM | POA: Diagnosis not present

## 2020-07-08 DIAGNOSIS — R1310 Dysphagia, unspecified: Secondary | ICD-10-CM | POA: Diagnosis not present

## 2020-07-11 ENCOUNTER — Telehealth: Payer: Self-pay

## 2020-07-11 NOTE — Telephone Encounter (Signed)
  Follow up Call-  Call back number 07/07/2020  Post procedure Call Back phone  # [phone]  Permission to leave phone message Yes  Some recent data might be hidden     Patient questions:  Do you have a fever, pain , or abdominal swelling? No. Pain Score  0 *  Have you tolerated food without any problems? Yes.    Have you been able to return to your normal activities? Yes.    Do you have any questions about your discharge instructions: Diet   No. Medications  No. Follow up visit  No.  Do you have questions or concerns about your Care? No.  Actions: * If pain score is 4 or above: No action needed, pain <4.  Have you developed a fever since your procedure? No 2.   Have you had an respiratory symptoms (SOB or cough) since your procedure? No  3.   Have you tested positive for COVID 19 since your procedure No  4.   Have you had any family members/close contacts diagnosed with the COVID 19 since your procedure?  No  If yes to any of these questions please route to Joylene John, RN and Joella Prince, RN

## 2020-07-12 ENCOUNTER — Encounter: Payer: Self-pay | Admitting: Internal Medicine

## 2020-07-12 ENCOUNTER — Other Ambulatory Visit: Payer: Self-pay

## 2020-07-12 ENCOUNTER — Ambulatory Visit (INDEPENDENT_AMBULATORY_CARE_PROVIDER_SITE_OTHER): Payer: BC Managed Care – PPO | Admitting: Internal Medicine

## 2020-07-12 VITALS — BP 116/72 | HR 77 | Temp 98.6°F | Resp 16 | Ht 70.0 in | Wt 160.0 lb

## 2020-07-12 DIAGNOSIS — E063 Autoimmune thyroiditis: Secondary | ICD-10-CM

## 2020-07-12 DIAGNOSIS — Z23 Encounter for immunization: Secondary | ICD-10-CM | POA: Diagnosis not present

## 2020-07-12 DIAGNOSIS — J4530 Mild persistent asthma, uncomplicated: Secondary | ICD-10-CM

## 2020-07-12 DIAGNOSIS — F411 Generalized anxiety disorder: Secondary | ICD-10-CM | POA: Diagnosis not present

## 2020-07-12 DIAGNOSIS — Z0000 Encounter for general adult medical examination without abnormal findings: Secondary | ICD-10-CM | POA: Diagnosis not present

## 2020-07-12 DIAGNOSIS — Z1231 Encounter for screening mammogram for malignant neoplasm of breast: Secondary | ICD-10-CM

## 2020-07-12 MED ORDER — ESCITALOPRAM OXALATE 5 MG PO TABS: 5.0000 mg | ORAL_TABLET | Freq: Every day | ORAL | 0 refills | Status: DC

## 2020-07-12 MED ORDER — QVAR REDIHALER 80 MCG/ACT IN AERB: INHALATION_SPRAY | RESPIRATORY_TRACT | 1 refills | Status: DC

## 2020-07-12 MED ORDER — VENTOLIN HFA 108 (90 BASE) MCG/ACT IN AERS: INHALATION_SPRAY | RESPIRATORY_TRACT | 3 refills | Status: DC

## 2020-07-12 NOTE — Patient Instructions (Signed)
http://www.aaaai.org/conditions-and-treatments/asthma">  Asthma, Adult  Asthma is a long-term (chronic) condition that causes recurrent episodes in which the airways become tight and narrow. The airways are the passages that lead from the nose and mouth down into the lungs. Asthma episodes, also called asthma attacks, can cause coughing, wheezing, shortness of breath, and chest pain. The airways can also fill with mucus. During an attack, it can be difficult to breathe. Asthma attacks can range from minor to life threatening. Asthma cannot be cured, but medicines and lifestyle changes can help control it and treat acute attacks. What are the causes? This condition is believed to be caused by inherited (genetic) and environmental factors, but its exact cause is not known. There are many things that can bring on an asthma attack or make asthma symptoms worse (triggers). Asthma triggers are different for each person. Common triggers include:  Mold.  Dust.  Cigarette smoke.  Cockroaches.  Things that can cause allergy symptoms (allergens), such as animal dander or pollen from trees or grass.  Air pollutants such as household cleaners, wood smoke, smog, or chemical odors.  Cold air, weather changes, and winds (which increase molds and pollen in the air).  Strong emotional expressions such as crying or laughing hard.  Stress.  Certain medicines (such as aspirin) or types of medicines (such as beta-blockers).  Sulfites in foods and drinks. Foods and drinks that may contain sulfites include dried fruit, potato chips, and sparkling grape juice.  Infections or inflammatory conditions such as the flu, a cold, or inflammation of the nasal membranes (rhinitis).  Gastroesophageal reflux disease (GERD).  Exercise or strenuous activity. What are the signs or symptoms? Symptoms of this condition may occur right after asthma is triggered or many hours later. Symptoms include:  Wheezing. This can  sound like whistling when you breathe.  Excessive nighttime or early morning coughing.  Frequent or severe coughing with a common cold.  Chest tightness.  Shortness of breath.  Tiredness (fatigue) with minimal activity. How is this diagnosed? This condition is diagnosed based on:  Your medical history.  A physical exam.  Tests, which may include: ? Lung function studies and pulmonary studies (spirometry). These tests can evaluate the flow of air in your lungs. ? Allergy tests. ? Imaging tests, such as X-rays. How is this treated? There is no cure for this condition, but treatment can help control your symptoms. Treatment for asthma usually involves:  Identifying and avoiding your asthma triggers.  Using medicines to control your symptoms. Generally, two types of medicines are used to treat asthma: ? Controller medicines. These help prevent asthma symptoms from occurring. They are usually taken every day. ? Fast-acting reliever or rescue medicines. These quickly relieve asthma symptoms by widening the narrow and tight airways. They are used as needed and provide short-term relief.  Using supplemental oxygen. This may be needed during a severe episode.  Using other medicines, such as: ? Allergy medicines, such as antihistamines, if your asthma attacks are triggered by allergens. ? Immune medicines (immunomodulators). These are medicines that help control the immune system.  Creating an asthma action plan. An asthma action plan is a written plan for managing and treating your asthma attacks. This plan includes: ? A list of your asthma triggers and how to avoid them. ? Information about when medicines should be taken and when their dosage should be changed. ? Instructions about using a device called a peak flow meter. A peak flow meter measures how well the lungs are working   and the severity of your asthma. It helps you monitor your condition. Follow these instructions at  home: Controlling your home environment Control your home environment in the following ways to help avoid triggers and prevent asthma attacks:  Change your heating and air conditioning filter regularly.  Limit your use of fireplaces and wood stoves.  Get rid of pests (such as roaches and mice) and their droppings.  Throw away plants if you see mold on them.  Clean floors and dust surfaces regularly. Use unscented cleaning products.  Try to have someone else vacuum for you regularly. Stay out of rooms while they are being vacuumed and for a short while afterward. If you vacuum, use a dust mask from a hardware store, a double-layered or microfilter vacuum cleaner bag, or a vacuum cleaner with a HEPA filter.  Replace carpet with wood, tile, or vinyl flooring. Carpet can trap dander and dust.  Use allergy-proof pillows, mattress covers, and box spring covers.  Keep your bedroom a trigger-free room.  Avoid pets and keep windows closed when allergens are in the air.  Wash beddings every week in hot water and dry them in a dryer.  Use blankets that are made of polyester or cotton.  Clean bathrooms and kitchens with bleach. If possible, have someone repaint the walls in these rooms with mold-resistant paint. Stay out of the rooms that are being cleaned and painted.  Wash your hands often with soap and water. If soap and water are not available, use hand sanitizer.  Do not allow anyone to smoke in your home. General instructions  Take over-the-counter and prescription medicines only as told by your health care provider. ? Speak with your health care provider if you have questions about how or when to take the medicines. ? Make note if you are requiring more frequent dosages.  Do not use any products that contain nicotine or tobacco, such as cigarettes and e-cigarettes. If you need help quitting, ask your health care provider. Also, avoid being exposed to secondhand smoke.  Use a peak  flow meter as told by your health care provider. Record and keep track of the readings.  Understand and use the asthma action plan to help minimize, or stop an asthma attack, without needing to seek medical care.  Make sure you stay up to date on your yearly vaccinations as told by your health care provider. This may include vaccines for the flu and pneumonia.  Avoid outdoor activities when allergen counts are high and when air quality is low.  Wear a ski mask that covers your nose and mouth during outdoor winter activities. Exercise indoors on cold days if you can.  Warm up before exercising, and take time for a cool-down period after exercise.  Keep all follow-up visits as told by your health care provider. This is important. Where to find more information  For information about asthma, turn to the Centers for Disease Control and Prevention at www.cdc.gov/asthma/faqs.htm  For air quality information, turn to [employer]Now at https://airnow.gov/ Contact a health care provider if:  You have wheezing, shortness of breath, or a cough even while you are taking medicine to prevent attacks.  The mucus you cough up (sputum) is thicker than usual.  Your sputum changes from clear or white to yellow, green, gray, or bloody.  Your medicines are causing side effects, such as a rash, itching, swelling, or trouble breathing.  You need to use a reliever medicine more than 2-3 times a week.  Your peak   flow reading is still at 50-79% of your personal best after following your action plan for 1 hour.  You have a fever. Get help right away if:  You are getting worse and do not respond to treatment during an asthma attack.  You are short of breath when at rest or when doing very little physical activity.  You have difficulty eating, drinking, or talking.  You have chest pain or tightness.  You develop a fast heartbeat or palpitations.  You have a bluish color to your lips or fingernails.  You  are light-headed or dizzy, or you faint.  Your peak flow reading is less than 50% of your personal best.  You feel too tired to breathe normally. Summary  Asthma is a long-term (chronic) condition that causes recurrent episodes in which the airways become tight and narrow. These episodes can cause coughing, wheezing, shortness of breath, and chest pain.  Asthma cannot be cured, but medicines and lifestyle changes can help control it and treat acute attacks.  Make sure you understand how to avoid triggers and how and when to use your medicines.  Asthma attacks can range from minor to life threatening. Get help right away if you have an asthma attack and do not respond to treatment with your usual rescue medicines. This information is not intended to replace advice given to you by your health care provider. Make sure you discuss any questions you have with your health care provider. Document Revised: 11/27/2018 Document Reviewed: 10/29/2016 [employer] Patient Education  2020 [employer]  

## 2020-07-12 NOTE — Progress Notes (Addendum)
Subjective:  Patient ID: Amy Bautista, female    DOB: 12/24/1975  Age: 44 y.o. MRN: [id number]  CC: Asthma, Hypothyroidism, and Annual Exam  This visit occurred during the SARS-CoV-2 public health emergency.  Safety protocols were in place, including screening questions prior to the visit, additional usage of staff PPE, and extensive cleaning of exam room while observing appropriate contact time as indicated for disinfecting solutions.   NEW TO ME  HPI [name] presents for a CPX.  She has a history of asthma which is well controlled on an ICS.  She requests a refill.  She has a history of anxiety disorder and is currently taking 5 mg of Escitalopram a day.  She continues to struggle with the symptoms and would like to increase the dose.  She has a history of hypothyroidism.  Her recent TSH was within normal limits.  Outpatient Medications Prior to Visit  Medication Sig Dispense Refill  . Cetirizine HCl (ZYRTEC PO) Take 1 tablet by mouth daily.     . Cholecalciferol (VITAMIN D PO) Take by mouth.    . EPIPEN 2-PAK 0.3 MG/0.3ML SOAJ injection inject as directed if needed for SEVERE ALLERGIC REACTION  0  . esomeprazole (NEXIUM) 20 MG capsule Take 20 mg by mouth daily at 12 noon.    . fluticasone (FLONASE) 50 MCG/ACT nasal spray Place 1 spray into both nostrils 2 (two) times daily.     . IRON PO Take by mouth.    . levothyroxine (SYNTHROID, LEVOTHROID) 125 MCG tablet Take 125 mcg by mouth daily before breakfast.    . Melatonin 1 MG CAPS Take 1 tablet by mouth at bedtime.    . Multiple Vitamin (MULTIVITAMIN) tablet Take 1 tablet by mouth daily.    Marland Kitchen escitalopram (LEXAPRO) 5 MG tablet Take 5 mg by mouth daily.    Marland Kitchen QVAR REDIHALER 80 MCG/ACT inhaler INHALE 1 PUFF INTO LUNGS DAILY 10.6 g 1  . VENTOLIN HFA 108 (90 Base) MCG/ACT inhaler INHALE 1 PUFF BY MOUTH EVERY 6 HOURS AS NEEDED 18 g 1  . levonorgestrel (MIRENA, 52 MG,) 20 MCG/24HR IUD 1 each by Intrauterine route once.  (Patient not taking: Reported on 07/12/2020)    . 0.9 %  sodium chloride infusion     . 0.9 %  sodium chloride infusion      No facility-administered medications prior to visit.    ROS Review of Systems  Constitutional: Negative for appetite change, diaphoresis, fatigue and unexpected weight change.  HENT: Negative.   Eyes: Negative for visual disturbance.  Respiratory: Negative for cough, chest tightness, shortness of breath and wheezing.   Cardiovascular: Negative for chest pain, palpitations and leg swelling.  Gastrointestinal: Negative for abdominal pain, constipation, diarrhea, nausea and vomiting.  Endocrine: Negative.  Negative for cold intolerance and heat intolerance.  Genitourinary: Negative.  Negative for difficulty urinating.  Musculoskeletal: Negative.   Skin: Negative.  Negative for pallor.  Neurological: Negative.  Negative for dizziness, weakness, light-headedness and headaches.  Hematological: Negative for adenopathy. Does not bruise/bleed easily.  Psychiatric/Behavioral: Positive for dysphoric mood. Negative for confusion, decreased concentration, self-injury, sleep disturbance and suicidal ideas. The patient is nervous/anxious. The patient is not hyperactive.     Objective:  BP 116/72   Pulse 77   Temp 98.6 F (37 C) (Oral)   Resp 16   Ht 5\' 10"  (1.778 m)   Wt 160 lb (72.6 kg)   LMP 06/28/2020 Comment: vascetomy  SpO2 98%  BMI 22.96 kg/m   BP Readings from Last 3 Encounters:  07/12/20 116/72  07/07/20 (!) 98/56  07/01/20 112/72    Wt Readings from Last 3 Encounters:  07/12/20 160 lb (72.6 kg)  07/07/20 159 lb (72.1 kg)  07/01/20 159 lb (72.1 kg)    Physical Exam Vitals reviewed.  HENT:     Nose: Nose normal.     Mouth/Throat:     Mouth: Mucous membranes are moist.  Eyes:     General: No scleral icterus.    Conjunctiva/sclera: Conjunctivae normal.  Cardiovascular:     Rate and Rhythm: Normal rate and regular rhythm.     Heart sounds: No  murmur heard.   Pulmonary:     Effort: Pulmonary effort is normal.     Breath sounds: No stridor. No wheezing, rhonchi or rales.  Abdominal:     General: Abdomen is flat. Bowel sounds are normal. There is no distension.     Palpations: Abdomen is soft. There is no hepatomegaly, splenomegaly or mass.     Tenderness: There is no abdominal tenderness.  Musculoskeletal:        General: Normal range of motion.     Cervical back: Neck supple.     Right lower leg: No edema.     Left lower leg: No edema.  Lymphadenopathy:     Cervical: No cervical adenopathy.  Skin:    General: Skin is warm and dry.  Neurological:     General: No focal deficit present.     Mental Status: She is alert.  Psychiatric:        Attention and Perception: Attention normal.        Mood and Affect: Affect normal. Mood is anxious.        Speech: Speech is not rapid and pressured, delayed or tangential.        Behavior: Behavior normal. Behavior is not slowed, aggressive, withdrawn or hyperactive.        Thought Content: Thought content normal. Thought content is not paranoid or delusional. Thought content does not include homicidal or suicidal ideation.        Cognition and Memory: Cognition normal.        Judgment: Judgment normal.     Lab Results  Component Value Date   WBC 6.6 07/01/2020   HGB 13.1 07/01/2020   HCT 38.8 07/01/2020   PLT 295.0 07/01/2020   GLUCOSE 93 07/01/2020   CHOL 161 05/13/2015   TRIG 68.0 05/13/2015   HDL 70.20 05/13/2015   LDLCALC 77 05/13/2015   ALT 9 07/01/2020   AST 13 07/01/2020   NA 141 07/01/2020   K 4.0 07/01/2020   CL 104 07/01/2020   CREATININE 0.85 07/01/2020   BUN 11 07/01/2020   CO2 28 07/01/2020   TSH 2.43 05/24/2020    US Abdomen Complete  Result Date: 07/08/2020 CLINICAL DATA:  Epigastric pain EXAM: ABDOMEN ULTRASOUND COMPLETE COMPARISON:  None. FINDINGS: Gallbladder: No gallstones or wall thickening visualized. No sonographic Murphy sign noted by  sonographer. Common bile duct: Diameter: 5 mm Liver: No focal lesion identified. Within normal limits in parenchymal echogenicity. Portal vein is patent on color Doppler imaging with normal direction of blood flow towards the liver. IVC: No abnormality visualized. Pancreas: Visualized portion unremarkable. Spleen: Size and appearance within normal limits. Right Kidney: Length: 11.2 cm. Echogenicity within normal limits. No mass or hydronephrosis visualized. Left Kidney: Length: 10.6 cm. Echogenicity within normal limits. No mass or hydronephrosis visualized. Abdominal aorta: No aneurysm  visualized. Other findings: None. IMPRESSION: Negative examination Electronically Signed   By: Donavan Foil M.D.   On: 07/08/2020 15:47    Assessment & Plan:   Aryelle was seen today for asthma, hypothyroidism and annual exam.  Diagnoses and all orders for this visit:  Visit for screening mammogram -     Cancel: MM DIGITAL SCREENING BILATERAL; Future -     MM 3D SCREEN BREAST BILATERAL; Future  Acquired autoimmune hypothyroidism- Her TSH is in the normal range.  She will stay on the current dose of levothyroxine.  Routine health maintenance- Exam completed, labs reviewed, vaccines reviewed and updated, cancer screenings addressed, patient education was given.  Mild persistent asthma without complication -     beclomethasone (QVAR REDIHALER) 80 MCG/ACT inhaler; INHALE 1 PUFF INTO LUNGS DAILY -     VENTOLIN HFA 108 (90 Base) MCG/ACT inhaler; INHALE 1 PUFF BY MOUTH EVERY 6 HOURS AS NEEDED  Generalized anxiety disorder- She will increase the dose of escitalopram to 10 mg a day. -     Discontinue: escitalopram (LEXAPRO) 5 MG tablet; Take 1 tablet (5 mg total) by mouth daily.  Other orders -     Flu Vaccine QUAD 6+ mos PF IM (Fluarix Quad PF) -     Pneumococcal polysaccharide vaccine 23-valent greater than or equal to 2yo subcutaneous/IM   I have discontinued [facility] [place]'s levonorgestrel and  escitalopram. I have also changed her Qvar RediHaler. Additionally, I am having her maintain her Cetirizine HCl (ZYRTEC PO), fluticasone, multivitamin, EpiPen 2-Pak, levothyroxine, Cholecalciferol (VITAMIN D PO), IRON PO, esomeprazole, Melatonin, and Ventolin HFA. We will stop administering sodium chloride and sodium chloride.  Meds ordered this encounter  Medications  . beclomethasone (QVAR REDIHALER) 80 MCG/ACT inhaler    Sig: INHALE 1 PUFF INTO LUNGS DAILY    Dispense:  31.8 g    Refill:  1  . VENTOLIN HFA 108 (90 Base) MCG/ACT inhaler    Sig: INHALE 1 PUFF BY MOUTH EVERY 6 HOURS AS NEEDED    Dispense:  18 g    Refill:  3  . DISCONTD: escitalopram (LEXAPRO) 5 MG tablet    Sig: Take 1 tablet (5 mg total) by mouth daily.    Dispense:  90 tablet    Refill:  0     Follow-up: Return in about 6 months (around 01/10/2021).  Scarlette Calico, MD

## 2020-07-14 ENCOUNTER — Encounter: Payer: Self-pay | Admitting: Gastroenterology

## 2020-07-14 ENCOUNTER — Other Ambulatory Visit: Payer: Self-pay | Admitting: Internal Medicine

## 2020-07-14 ENCOUNTER — Encounter: Payer: Self-pay | Admitting: Internal Medicine

## 2020-07-14 DIAGNOSIS — F411 Generalized anxiety disorder: Secondary | ICD-10-CM

## 2020-07-14 MED ORDER — ESCITALOPRAM OXALATE 10 MG PO TABS: 10.0000 mg | ORAL_TABLET | Freq: Every day | ORAL | 1 refills | Status: DC

## 2020-07-15 DIAGNOSIS — F4323 Adjustment disorder with mixed anxiety and depressed mood: Secondary | ICD-10-CM | POA: Diagnosis not present

## 2020-08-09 ENCOUNTER — Ambulatory Visit: Payer: BC Managed Care – PPO

## 2020-08-12 DIAGNOSIS — F4323 Adjustment disorder with mixed anxiety and depressed mood: Secondary | ICD-10-CM | POA: Diagnosis not present

## 2020-09-16 ENCOUNTER — Ambulatory Visit: Payer: BC Managed Care – PPO

## 2020-10-28 ENCOUNTER — Ambulatory Visit
Admission: RE | Admit: 2020-10-28 | Discharge: 2020-10-28 | Disposition: A | Discharge status: Home / Self Care | Payer: BC Managed Care – PPO | Source: Ambulatory Visit | Attending: Internal Medicine | Admitting: Internal Medicine

## 2020-10-28 ENCOUNTER — Other Ambulatory Visit: Payer: Self-pay

## 2020-10-28 DIAGNOSIS — Z1231 Encounter for screening mammogram for malignant neoplasm of breast: Secondary | ICD-10-CM

## 2020-10-28 LAB — HM MAMMOGRAPHY

## 2020-10-31 ENCOUNTER — Other Ambulatory Visit: Payer: Self-pay | Admitting: Optometry

## 2020-10-31 ENCOUNTER — Other Ambulatory Visit: Payer: Self-pay | Admitting: *Deleted

## 2020-10-31 ENCOUNTER — Other Ambulatory Visit: Payer: Self-pay | Admitting: Internal Medicine

## 2020-10-31 DIAGNOSIS — R928 Other abnormal and inconclusive findings on diagnostic imaging of breast: Secondary | ICD-10-CM

## 2020-11-10 ENCOUNTER — Ambulatory Visit
Admission: RE | Admit: 2020-11-10 | Discharge: 2020-11-10 | Disposition: A | Discharge status: Home / Self Care | Payer: BC Managed Care – PPO | Source: Ambulatory Visit | Attending: Internal Medicine | Admitting: Internal Medicine

## 2020-11-10 ENCOUNTER — Other Ambulatory Visit: Payer: Self-pay

## 2020-11-10 ENCOUNTER — Other Ambulatory Visit: Payer: Self-pay | Admitting: Internal Medicine

## 2020-11-10 DIAGNOSIS — R928 Other abnormal and inconclusive findings on diagnostic imaging of breast: Secondary | ICD-10-CM

## 2020-11-10 DIAGNOSIS — R922 Inconclusive mammogram: Secondary | ICD-10-CM | POA: Diagnosis not present

## 2020-11-10 DIAGNOSIS — N6489 Other specified disorders of breast: Secondary | ICD-10-CM

## 2020-11-11 ENCOUNTER — Encounter: Payer: Self-pay | Admitting: Internal Medicine

## 2020-11-11 ENCOUNTER — Other Ambulatory Visit: Payer: Self-pay | Admitting: Internal Medicine

## 2020-11-11 DIAGNOSIS — F411 Generalized anxiety disorder: Secondary | ICD-10-CM

## 2020-11-11 MED ORDER — ESCITALOPRAM OXALATE 5 MG PO TABS
5.0000 mg | ORAL_TABLET | Freq: Every day | ORAL | 1 refills | Status: DC
Start: 2020-11-11 — End: 2022-06-22

## 2020-12-12 DIAGNOSIS — E063 Autoimmune thyroiditis: Secondary | ICD-10-CM | POA: Diagnosis not present

## 2020-12-12 DIAGNOSIS — E890 Postprocedural hypothyroidism: Secondary | ICD-10-CM | POA: Diagnosis not present

## 2020-12-16 DIAGNOSIS — E890 Postprocedural hypothyroidism: Secondary | ICD-10-CM | POA: Diagnosis not present

## 2020-12-16 DIAGNOSIS — C73 Malignant neoplasm of thyroid gland: Secondary | ICD-10-CM | POA: Diagnosis not present

## 2020-12-16 DIAGNOSIS — E559 Vitamin D deficiency, unspecified: Secondary | ICD-10-CM | POA: Diagnosis not present

## 2020-12-16 DIAGNOSIS — E063 Autoimmune thyroiditis: Secondary | ICD-10-CM | POA: Diagnosis not present

## 2021-01-11 ENCOUNTER — Other Ambulatory Visit: Payer: Self-pay

## 2021-01-11 DIAGNOSIS — J4530 Mild persistent asthma, uncomplicated: Secondary | ICD-10-CM

## 2021-03-20 ENCOUNTER — Other Ambulatory Visit: Payer: Self-pay | Admitting: Internal Medicine

## 2021-03-20 DIAGNOSIS — J4530 Mild persistent asthma, uncomplicated: Secondary | ICD-10-CM

## 2021-05-10 ENCOUNTER — Other Ambulatory Visit: Payer: Self-pay

## 2021-05-10 ENCOUNTER — Ambulatory Visit: Payer: BC Managed Care – PPO | Admitting: Obstetrics and Gynecology

## 2021-05-10 ENCOUNTER — Encounter: Payer: Self-pay | Admitting: Obstetrics and Gynecology

## 2021-05-10 VITALS — BP 110/68 | HR 90

## 2021-05-10 DIAGNOSIS — R300 Dysuria: Secondary | ICD-10-CM

## 2021-05-10 DIAGNOSIS — N949 Unspecified condition associated with female genital organs and menstrual cycle: Secondary | ICD-10-CM | POA: Diagnosis not present

## 2021-05-10 LAB — WET PREP FOR TRICH, YEAST, CLUE

## 2021-05-10 NOTE — Progress Notes (Signed)
GYNECOLOGY  VISIT   HPI: 45 y.o.   Married White or Caucasian Not Hispanic or Latino  female   G2P2 with Patient's last menstrual period was 05/09/2021.   here for dysuria. She states that she was backpacking and camping and had sex. She states that into the night she got more burning. She has been on Macrobid x5 days. She tried monistat one day too.   She took the morning after pill in the past month as well.   Approximately 6 days ago she had sex while on a camping trip, wasn't as lubricated as normal and sex was more vigorous than normal. Shortly after she started to have vaginal burning and discomfort. The whole night she was uncomfortable. The next few days her symptoms waxed and waned. She started having some dysuria (thinks external). Wasn't able to have intercourse again, too uncomfortable. She did an on line visit and was given antibiotics for a UTI. Started to feel a little better, 2 days ago she used monistat since she still felt burning. The monistat made her more uncomfortable.  Prior to the monistat no increased d/c, no itching.  She started her menses yesterday.  She has had the same partner for one year.   She reports occasional postcoital spotting midcycle only.   GYNECOLOGIC HISTORY: Patient's last menstrual period was 05/09/2021. 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   Acquired autoimmune hypothyroidism 04/01/2015   Generalized anxiety disorder 03/31/2015   Vitamin D deficiency 03/23/2015   Routine health maintenance 12/25/2012   PMS (premenstrual syndrome) 07/07/2012   ALLERGIC  RHINITIS 11/04/2007   Asthma 11/04/2007    Past Medical History:  Diagnosis Date   Allergy    Anxiety    Asthma    Cancer ([place])    thyroid   Dysplastic nevi    excised lesions: Left UE, Right LE   Pneumonia    hx   Thyroid mass     Past Surgical History:  Procedure Laterality Date   G2P2     INTRAUTERINE DEVICE INSERTION  04/28/2018   Mirena   KNEE SURGERY     Arthroscopic   orthodontic     THYROIDECTOMY N/A 12/01/2015   Procedure: TOTAL THYROIDECTOMY;  Surgeon: Armandina Gemma, MD;  Location: [facility];  Service: General;  Laterality: N/A;    Current Outpatient Medications  Medication Sig Dispense Refill   beclomethasone (QVAR REDIHALER) 80 MCG/ACT inhaler INHALE 1 PUFF INTO LUNGS DAILY 31.8 g 1   Cetirizine HCl (ZYRTEC PO) Take 1 tablet by mouth daily.      Cholecalciferol (VITAMIN D PO) Take by mouth.     EPIPEN 2-PAK 0.3 MG/0.3ML SOAJ injection inject as directed if needed for SEVERE ALLERGIC REACTION  0   escitalopram (LEXAPRO) 5 MG tablet Take 1 tablet (5 mg total) by  mouth daily. 90 tablet 1   esomeprazole (NEXIUM) 20 MG capsule Take 20 mg by mouth daily at 12 noon.     fluticasone (FLONASE) 50 MCG/ACT nasal spray Place 1 spray into both nostrils 2 (two) times daily.      IRON PO Take by mouth.     levothyroxine (SYNTHROID, LEVOTHROID) 125 MCG tablet Take 125 mcg by mouth daily before breakfast.     Melatonin 1 MG CAPS Take 1 tablet by mouth at bedtime.     Multiple Vitamin (MULTIVITAMIN) tablet Take 1 tablet by mouth daily.     nitrofurantoin, macrocrystal-monohydrate, (MACROBID) 100 MG capsule Take 100 mg by mouth 2 (two) times daily.     VENTOLIN HFA 108 (90 Base) MCG/ACT inhaler INHALE 1 PUFF BY MOUTH EVERY 6 HOURS AS NEEDED 18 g 3   No current facility-administered medications for this visit.     ALLERGIES: Doxycycline, Fish allergy, Other, Sertraline, Shellfish allergy, and Zoloft [sertraline hcl]  Family History  Problem Relation Age of Onset   Cancer Father         melanoma   Peptic Ulcer Disease Father    Peptic Ulcer Disease Mother    Hypertension Brother    Hypertension Paternal Grandfather    Heart disease Paternal Grandfather    Cancer Paternal Grandmother        LUNG    Breast cancer Maternal Grandmother        70's   Thyroid disease Neg Hx    Colon cancer Neg Hx    Stomach cancer Neg Hx    Esophageal cancer Neg Hx    Inflammatory bowel disease Neg Hx    Liver disease Neg Hx    Rectal cancer Neg Hx    Pancreatic cancer Neg Hx     Social History   Socioeconomic History   Marital status: Married    Spouse name: Not on file   Number of children: 2   Years of education: 12+   Highest education level: Not on file  Occupational History   Occupation: Interior and spatial designer: SELF  Tobacco Use   Smoking status: Never   Smokeless tobacco: Never  Vaping Use   Vaping Use: Never used  Substance and Sexual Activity   Alcohol use: No   Drug use: No   Sexual activity: Yes    Partners: Male    Birth control/protection: I.U.D.    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 No h/o abuse.   Social Determinants of Health   Financial Resource Strain: Not on file  Food Insecurity: Not on file  Transportation Needs: Not on file  Physical Activity: Not on file  Stress: Not on file  Social Connections: Not on file  Intimate Partner Violence: Not on file    Review of Systems  Genitourinary:  Positive for dysuria.   PHYSICAL EXAMINATION:    BP 110/68   Pulse 90   LMP 05/09/2021   SpO2 100%     General appearance: alert, cooperative and appears stated age   Pelvic: External genitalia:  no lesions, minimal erythema in the posterior fourchette              Urethra:  normal appearing urethra with no masses, tenderness  or lesions  Bartholins and Skenes: normal                 Vagina: normal appearing vagina with normal color and discharge, no lesions              Cervix: no cervical motion tenderness and no lesions              Bimanual Exam:  Uterus:  normal size, contour, position, consistency, mobility, non-tender              Adnexa: no mass, fullness, tenderness               Chaperone was present for exam.  1. Vaginal burning Suspect she got irritated from not being lubricated and the friction - WET PREP FOR TRICH, YEAST, CLUE - SureSwab Bacterial Vaginosis/itis -Discussed vulvar skin care, can use vaseline externally, can use coconut oil internally or externally, can try Replens vaginal moisturizer.   2. Dysuria Mild, treated on line for a UTI. - Urinalysis,Complete w/RFL Culture

## 2021-05-12 LAB — URINALYSIS, COMPLETE W/RFL CULTURE
Bacteria, UA: NONE SEEN /HPF
Bilirubin Urine: NEGATIVE
Glucose, UA: NEGATIVE
Hyaline Cast: NONE SEEN /LPF
Ketones, ur: NEGATIVE
Leukocyte Esterase: NEGATIVE
Nitrites, Initial: NEGATIVE
Protein, ur: NEGATIVE
Specific Gravity, Urine: 1.01 (ref 1.001–1.035)
pH: 6 (ref 5.0–8.0)

## 2021-05-12 LAB — URINE CULTURE
MICRO NUMBER:: 12196548
Result:: NO GROWTH
SPECIMEN QUALITY:: ADEQUATE

## 2021-05-12 LAB — CULTURE INDICATED

## 2021-05-14 LAB — SURESWAB BACTERIAL VAGINOSIS/ITIS
Atopobium vaginae: NOT DETECTED Log (cells/mL)
C. albicans, DNA: NOT DETECTED
C. glabrata, DNA: NOT DETECTED
C. parapsilosis, DNA: NOT DETECTED
C. tropicalis, DNA: NOT DETECTED
Gardnerella vaginalis: <4.7 Log (cells/mL)
LACTOBACILLUS SPECIES: 5.2 Log (cells/mL)
MEGASPHAERA SPECIES: NOT DETECTED Log (cells/mL)
Trichomonas vaginalis RNA: NOT DETECTED

## 2021-05-30 ENCOUNTER — Other Ambulatory Visit: Payer: BC Managed Care – PPO

## 2021-06-09 DIAGNOSIS — D2261 Melanocytic nevi of right upper limb, including shoulder: Secondary | ICD-10-CM | POA: Diagnosis not present

## 2021-06-09 DIAGNOSIS — D2262 Melanocytic nevi of left upper limb, including shoulder: Secondary | ICD-10-CM | POA: Diagnosis not present

## 2021-06-09 DIAGNOSIS — D0359 Melanoma in situ of other part of trunk: Secondary | ICD-10-CM | POA: Diagnosis not present

## 2021-06-09 DIAGNOSIS — I788 Other diseases of capillaries: Secondary | ICD-10-CM | POA: Diagnosis not present

## 2021-06-09 DIAGNOSIS — D225 Melanocytic nevi of trunk: Secondary | ICD-10-CM | POA: Diagnosis not present

## 2021-06-16 DIAGNOSIS — E890 Postprocedural hypothyroidism: Secondary | ICD-10-CM | POA: Diagnosis not present

## 2021-06-16 DIAGNOSIS — C73 Malignant neoplasm of thyroid gland: Secondary | ICD-10-CM | POA: Diagnosis not present

## 2021-06-19 DIAGNOSIS — Z85850 Personal history of malignant neoplasm of thyroid: Secondary | ICD-10-CM | POA: Diagnosis not present

## 2021-06-19 DIAGNOSIS — E890 Postprocedural hypothyroidism: Secondary | ICD-10-CM | POA: Diagnosis not present

## 2021-06-23 ENCOUNTER — Other Ambulatory Visit: Payer: Self-pay

## 2021-07-06 NOTE — Progress Notes (Deleted)
45 y.o. G2P2 Married White or Caucasian Not Hispanic or Latino female here for annual exam.      No LMP recorded. (Menstrual status: Other).          Sexually active: {yes no:314532}  The current method of family planning is {contraception:315051}.    Exercising: {yes no:314532}  {types:19826} Smoker:  {YES [id number]  Health Maintenance: Pap:  09-14-19 normal History of abnormal Pap:  {YES NO:22349} MMG:  10-28-20 Possible asymmetry left breast--11-10-20 Diag mammo & U/S left---fibrocystic changes--U/S in 6 months BMD:   N/A Colonoscopy: N/A TDaP:  2014 Gardasil: no   reports that she has never smoked. She has never used smokeless tobacco. She reports that she does not drink alcohol and does not use drugs.  Past Medical History:  Diagnosis Date   Allergy    Anxiety    Asthma    Cancer ([place])    thyroid   Dysplastic nevi    excised lesions: Left UE, Right LE   Pneumonia    hx   Thyroid mass     Past Surgical History:  Procedure Laterality Date   G2P2     INTRAUTERINE DEVICE INSERTION  04/28/2018   Mirena   KNEE SURGERY     Arthroscopic   orthodontic     THYROIDECTOMY N/A 12/01/2015   Procedure: TOTAL THYROIDECTOMY;  Surgeon: Armandina Gemma, MD;  Location: [facility];  Service: General;  Laterality: N/A;    Current Outpatient Medications  Medication Sig Dispense Refill   beclomethasone (QVAR REDIHALER) 80 MCG/ACT inhaler INHALE 1 PUFF INTO LUNGS DAILY 31.8 g 1   Cetirizine HCl (ZYRTEC PO) Take 1 tablet by mouth daily.      Cholecalciferol (VITAMIN D PO) Take by mouth.     EPIPEN 2-PAK 0.3 MG/0.3ML SOAJ injection inject as directed if needed for SEVERE ALLERGIC REACTION  0   escitalopram (LEXAPRO) 5 MG tablet Take 1 tablet (5 mg total) by mouth daily. 90 tablet 1   esomeprazole (NEXIUM) 20 MG capsule Take 20 mg by mouth daily at 12 noon.     fluticasone (FLONASE) 50 MCG/ACT nasal spray Place 1 spray into both nostrils 2 (two) times daily.      IRON PO Take by mouth.      levothyroxine (SYNTHROID, LEVOTHROID) 125 MCG tablet Take 125 mcg by mouth daily before breakfast.     Melatonin 1 MG CAPS Take 1 tablet by mouth at bedtime.     Multiple Vitamin (MULTIVITAMIN) tablet Take 1 tablet by mouth daily.     nitrofurantoin, macrocrystal-monohydrate, (MACROBID) 100 MG capsule Take 100 mg by mouth 2 (two) times daily.     VENTOLIN HFA 108 (90 Base) MCG/ACT inhaler INHALE 1 PUFF BY MOUTH EVERY 6 HOURS AS NEEDED 18 g 3   No current facility-administered medications for this visit.    Family History  Problem Relation Age of Onset   Cancer Father        melanoma   Peptic Ulcer Disease Father    Peptic Ulcer Disease Mother    Hypertension Brother    Hypertension Paternal Grandfather    Heart disease Paternal Grandfather    Cancer Paternal Grandmother        LUNG    Breast cancer Maternal Grandmother        70's   Thyroid disease Neg Hx    Colon cancer Neg Hx    Stomach cancer Neg Hx    Esophageal cancer Neg Hx    Inflammatory bowel disease Neg  Hx    Liver disease Neg Hx    Rectal cancer Neg Hx    Pancreatic cancer Neg Hx     Review of Systems  Exam:   There were no vitals taken for this visit.  Weight change: @WEIGHTCHANGE @ Height:      Ht Readings from Last 3 Encounters:  07/12/20 5\' 10"  (1.778 m)  07/07/20 5\' 10"  (1.778 m)  07/01/20 5\' 10"  (1.778 m)    General appearance: alert, cooperative and appears stated age Head: Normocephalic, without obvious abnormality, atraumatic Neck: no adenopathy, supple, symmetrical, trachea midline and thyroid {CHL AMB PHY EX THYROID NORM DEFAULT:[phone]::"normal to inspection and palpation"} Lungs: clear to auscultation bilaterally Cardiovascular: regular rate and rhythm Breasts: {Exam; breast:13139::"normal appearance, no masses or tenderness"} Abdomen: soft, non-tender; non distended,  no masses,  no organomegaly Extremities: extremities normal, atraumatic, no cyanosis or edema Skin: Skin color, texture,  turgor normal. No rashes or lesions Lymph nodes: Cervical, supraclavicular, and axillary nodes normal. No abnormal inguinal nodes palpated Neurologic: Grossly normal   Pelvic: External genitalia:  no lesions              Urethra:  normal appearing urethra with no masses, tenderness or lesions              Bartholins and Skenes: normal                 Vagina: normal appearing vagina with normal color and discharge, no lesions              Cervix: {CHL AMB PHY EX CERVIX NORM DEFAULT:[phone]::"no lesions"}               Bimanual Exam:  Uterus:  {CHL AMB PHY EX UTERUS NORM [phone]::"normal size, contour, position, consistency, mobility, non-tender"}              Adnexa: {CHL AMB PHY EX ADNEXA NO MASS DEFAULT:[phone]::"no mass, fullness, tenderness"}               Rectovaginal: Confirms               Anus:  normal sphincter tone, no lesions  *** chaperoned for the exam.  A:  Well Woman with normal exam  P:

## 2021-07-14 ENCOUNTER — Ambulatory Visit: Payer: BC Managed Care – PPO | Admitting: Obstetrics and Gynecology

## 2021-07-24 ENCOUNTER — Other Ambulatory Visit: Payer: Self-pay | Admitting: Internal Medicine

## 2021-07-24 ENCOUNTER — Encounter: Payer: Self-pay | Admitting: Internal Medicine

## 2021-07-24 DIAGNOSIS — J4530 Mild persistent asthma, uncomplicated: Secondary | ICD-10-CM

## 2021-07-24 MED ORDER — VENTOLIN HFA 108 (90 BASE) MCG/ACT IN AERS: INHALATION_SPRAY | RESPIRATORY_TRACT | 3 refills | Status: AC

## 2021-07-24 NOTE — Addendum Note (Signed)
Addended by: Hinda Kehr on: 07/24/2021 10:54 AM   Modules accepted: Orders

## 2021-08-04 ENCOUNTER — Other Ambulatory Visit: Payer: Self-pay

## 2021-08-21 ENCOUNTER — Other Ambulatory Visit: Payer: Self-pay | Admitting: Internal Medicine

## 2021-08-21 DIAGNOSIS — J4530 Mild persistent asthma, uncomplicated: Secondary | ICD-10-CM

## 2021-09-07 ENCOUNTER — Other Ambulatory Visit: Payer: Self-pay

## 2021-09-12 NOTE — Progress Notes (Deleted)
45 y.o. G2P2 Married White or Caucasian Not Hispanic or Latino female here for annual exam.      No LMP recorded. (Menstrual status: Other).          Sexually active: {yes no:314532}  The current method of family planning is {contraception:315051}.    Exercising: {yes no:314532}  {types:19826} Smoker:  {YES NO:22349}  Health Maintenance: Pap:  09-14-19 normal History of abnormal Pap:  {YES NO:22349} MMG:  10-28-20 Possible asymmetry in left br.--11-10-20 Unilateral mammo and U/S left breast -fibrocystic BMD:   N/A Colonoscopy: N/A TDaP:  2014 Gardasil: ***   reports that she has never smoked. She has never used smokeless tobacco. She reports that she does not drink alcohol and does not use drugs.  Past Medical History:  Diagnosis Date   Allergy    Anxiety    Asthma    Cancer ([place])    thyroid   Dysplastic nevi    excised lesions: Left UE, Right LE   Pneumonia    hx   Thyroid mass     Past Surgical History:  Procedure Laterality Date   G2P2     INTRAUTERINE DEVICE INSERTION  04/28/2018   Mirena   KNEE SURGERY     Arthroscopic   orthodontic     THYROIDECTOMY N/A 12/01/2015   Procedure: TOTAL THYROIDECTOMY;  Surgeon: Armandina Gemma, MD;  Location: [facility];  Service: General;  Laterality: N/A;    Current Outpatient Medications  Medication Sig Dispense Refill   Cetirizine HCl (ZYRTEC PO) Take 1 tablet by mouth daily.      Cholecalciferol (VITAMIN D PO) Take by mouth.     EPIPEN 2-PAK 0.3 MG/0.3ML SOAJ injection inject as directed if needed for SEVERE ALLERGIC REACTION  0   escitalopram (LEXAPRO) 5 MG tablet Take 1 tablet (5 mg total) by mouth daily. 90 tablet 1   esomeprazole (NEXIUM) 20 MG capsule Take 20 mg by mouth daily at 12 noon.     fluticasone (FLONASE) 50 MCG/ACT nasal spray Place 1 spray into both nostrils 2 (two) times daily.      IRON PO Take by mouth.     levothyroxine (SYNTHROID, LEVOTHROID) 125 MCG tablet Take 125 mcg by mouth daily before breakfast.      Melatonin 1 MG CAPS Take 1 tablet by mouth at bedtime.     Multiple Vitamin (MULTIVITAMIN) tablet Take 1 tablet by mouth daily.     nitrofurantoin, macrocrystal-monohydrate, (MACROBID) 100 MG capsule Take 100 mg by mouth 2 (two) times daily.     QVAR REDIHALER 80 MCG/ACT inhaler INHALE 1 PUFF INTO LUNGS DAILY 31.8 g 1   VENTOLIN HFA 108 (90 Base) MCG/ACT inhaler INHALE 1 PUFF BY MOUTH EVERY 6 HOURS AS NEEDED 18 g 3   No current facility-administered medications for this visit.    Family History  Problem Relation Age of Onset   Cancer Father        melanoma   Peptic Ulcer Disease Father    Peptic Ulcer Disease Mother    Hypertension Brother    Hypertension Paternal Grandfather    Heart disease Paternal Grandfather    Cancer Paternal Grandmother        LUNG    Breast cancer Maternal Grandmother        70's   Thyroid disease Neg Hx    Colon cancer Neg Hx    Stomach cancer Neg Hx    Esophageal cancer Neg Hx    Inflammatory bowel disease Neg Hx  Liver disease Neg Hx    Rectal cancer Neg Hx    Pancreatic cancer Neg Hx     Review of Systems  Exam:   There were no vitals taken for this visit.  Weight change: @WEIGHTCHANGE @ Height:      Ht Readings from Last 3 Encounters:  07/12/20 5\' 10"  (1.778 m)  07/07/20 5\' 10"  (1.778 m)  07/01/20 5\' 10"  (1.778 m)    General appearance: alert, cooperative and appears stated age Head: Normocephalic, without obvious abnormality, atraumatic Neck: no adenopathy, supple, symmetrical, trachea midline and thyroid {CHL AMB PHY EX THYROID NORM [phone]::"normal to inspection and palpation"} Lungs: clear to auscultation bilaterally Cardiovascular: regular rate and rhythm Breasts: {Exam; breast:13139::"normal appearance, no masses or tenderness"} Abdomen: soft, non-tender; non distended,  no masses,  no organomegaly Extremities: extremities normal, atraumatic, no cyanosis or edema Skin: Skin color, texture, turgor normal. No rashes or  lesions Lymph nodes: Cervical, supraclavicular, and axillary nodes normal. No abnormal inguinal nodes palpated Neurologic: Grossly normal   Pelvic: External genitalia:  no lesions              Urethra:  normal appearing urethra with no masses, tenderness or lesions              Bartholins and Skenes: normal                 Vagina: normal appearing vagina with normal color and discharge, no lesions              Cervix: {CHL AMB PHY EX CERVIX NORM [phone]::"no lesions"}               Bimanual Exam:  Uterus:  {CHL AMB PHY EX UTERUS NORM [phone]::"normal size, contour, position, consistency, mobility, non-tender"}              Adnexa: {CHL AMB PHY EX ADNEXA NO MASS DEFAULT:[phone]::"no mass, fullness, tenderness"}               Rectovaginal: Confirms               Anus:  normal sphincter tone, no lesions  *** chaperoned for the exam.  A:  Well Woman with normal exam  P:

## 2021-09-22 ENCOUNTER — Ambulatory Visit: Payer: BC Managed Care – PPO | Admitting: Obstetrics and Gynecology

## 2021-10-11 ENCOUNTER — Other Ambulatory Visit: Payer: Self-pay | Admitting: Internal Medicine

## 2021-10-11 DIAGNOSIS — N6489 Other specified disorders of breast: Secondary | ICD-10-CM

## 2021-10-12 ENCOUNTER — Other Ambulatory Visit: Payer: Self-pay

## 2021-10-30 NOTE — Telephone Encounter (Signed)
Closing encounter

## 2021-11-07 ENCOUNTER — Other Ambulatory Visit: Payer: Self-pay

## 2021-11-16 ENCOUNTER — Ambulatory Visit
Admission: RE | Admit: 2021-11-16 | Discharge: 2021-11-16 | Disposition: A | Discharge status: Home / Self Care | Payer: 59 | Source: Ambulatory Visit | Attending: Internal Medicine | Admitting: Internal Medicine

## 2021-11-16 ENCOUNTER — Ambulatory Visit: Payer: Self-pay

## 2021-11-16 DIAGNOSIS — N6489 Other specified disorders of breast: Secondary | ICD-10-CM

## 2021-11-16 DIAGNOSIS — R922 Inconclusive mammogram: Secondary | ICD-10-CM | POA: Diagnosis not present

## 2021-11-21 NOTE — Progress Notes (Unsigned)
46 y.o. G2P2 Married White or Caucasian Not Hispanic or Latino female here for annual exam.      No LMP recorded. (Menstrual status: Other).          Sexually active: {yes no:314532}  The current method of family planning is {contraception:315051}.    Exercising: {yes no:314532}  {types:19826} Smoker:  {YES [id number]  Health Maintenance: Pap:  09/14/2019 WNL 02/07/17 WNL  History of abnormal Pap:  {YES NO:22349} MMG:  11/16/21 BI-rads 3 probably benign ( [place] ordered)*** BMD:   none  Colonoscopy: none  TDaP:  12/24/12  Gardasil: none   reports that she has never smoked. She has never used smokeless tobacco. She reports that she does not drink alcohol and does not use drugs.  Past Medical History:  Diagnosis Date   Allergy    Anxiety    Asthma    Cancer ([place])    thyroid   Dysplastic nevi    excised lesions: Left UE, Right LE   Pneumonia    hx   Thyroid mass     Past Surgical History:  Procedure Laterality Date   G2P2     INTRAUTERINE DEVICE INSERTION  04/28/2018   Mirena   KNEE SURGERY     Arthroscopic   orthodontic     THYROIDECTOMY N/A 12/01/2015   Procedure: TOTAL THYROIDECTOMY;  Surgeon: Armandina Gemma, MD;  Location: [facility];  Service: General;  Laterality: N/A;    Current Outpatient Medications  Medication Sig Dispense Refill   Cetirizine HCl (ZYRTEC PO) Take 1 tablet by mouth daily.      Cholecalciferol (VITAMIN D PO) Take by mouth.     EPIPEN 2-PAK 0.3 MG/0.3ML SOAJ injection inject as directed if needed for SEVERE ALLERGIC REACTION  0   escitalopram (LEXAPRO) 5 MG tablet Take 1 tablet (5 mg total) by mouth daily. 90 tablet 1   esomeprazole (NEXIUM) 20 MG capsule Take 20 mg by mouth daily at 12 noon.     fluticasone (FLONASE) 50 MCG/ACT nasal spray Place 1 spray into both nostrils 2 (two) times daily.      IRON PO Take by mouth.     levothyroxine (SYNTHROID, LEVOTHROID) 125 MCG tablet Take 125 mcg by mouth daily before breakfast.     Melatonin 1 MG CAPS Take 1 tablet  by mouth at bedtime.     Multiple Vitamin (MULTIVITAMIN) tablet Take 1 tablet by mouth daily.     nitrofurantoin, macrocrystal-monohydrate, (MACROBID) 100 MG capsule Take 100 mg by mouth 2 (two) times daily.     QVAR REDIHALER 80 MCG/ACT inhaler INHALE 1 PUFF INTO LUNGS DAILY 31.8 g 1   VENTOLIN HFA 108 (90 Base) MCG/ACT inhaler INHALE 1 PUFF BY MOUTH EVERY 6 HOURS AS NEEDED 18 g 3   No current facility-administered medications for this visit.    Family History  Problem Relation Age of Onset   Cancer Father        melanoma   Peptic Ulcer Disease Father    Peptic Ulcer Disease Mother    Hypertension Brother    Hypertension Paternal Grandfather    Heart disease Paternal Grandfather    Cancer Paternal Grandmother        LUNG    Breast cancer Maternal Grandmother        70's   Thyroid disease Neg Hx    Colon cancer Neg Hx    Stomach cancer Neg Hx    Esophageal cancer Neg Hx    Inflammatory bowel disease Neg Hx  Liver disease Neg Hx    Rectal cancer Neg Hx    Pancreatic cancer Neg Hx     Review of Systems  Exam:   There were no vitals taken for this visit.  Weight change: @WEIGHTCHANGE @ Height:      Ht Readings from Last 3 Encounters:  07/12/20 5\' 10"  (1.778 m)  07/07/20 5\' 10"  (1.778 m)  07/01/20 5\' 10"  (1.778 m)    General appearance: alert, cooperative and appears stated age Head: Normocephalic, without obvious abnormality, atraumatic Neck: no adenopathy, supple, symmetrical, trachea midline and thyroid {CHL AMB PHY EX THYROID NORM DEFAULT:[phone]::"normal to inspection and palpation"} Lungs: clear to auscultation bilaterally Cardiovascular: regular rate and rhythm Breasts: {Exam; breast:13139::"normal appearance, no masses or tenderness"} Abdomen: soft, non-tender; non distended,  no masses,  no organomegaly Extremities: extremities normal, atraumatic, no cyanosis or edema Skin: Skin color, texture, turgor normal. No rashes or lesions Lymph nodes: Cervical,  supraclavicular, and axillary nodes normal. No abnormal inguinal nodes palpated Neurologic: Grossly normal   Pelvic: External genitalia:  no lesions              Urethra:  normal appearing urethra with no masses, tenderness or lesions              Bartholins and Skenes: normal                 Vagina: normal appearing vagina with normal color and discharge, no lesions              Cervix: {CHL AMB PHY EX CERVIX NORM [phone]::"no lesions"}               Bimanual Exam:  Uterus:  {CHL AMB PHY EX UTERUS NORM DEFAULT:[phone]::"normal size, contour, position, consistency, mobility, non-tender"}              Adnexa: {CHL AMB PHY EX ADNEXA NO MASS DEFAULT:[phone]::"no mass, fullness, tenderness"}               Rectovaginal: Confirms               Anus:  normal sphincter tone, no lesions  *** chaperoned for the exam.  A:  Well Woman with normal exam  P:

## 2021-11-30 ENCOUNTER — Ambulatory Visit: Payer: BC Managed Care – PPO | Admitting: Obstetrics and Gynecology

## 2021-12-22 DIAGNOSIS — E890 Postprocedural hypothyroidism: Secondary | ICD-10-CM | POA: Diagnosis not present

## 2022-02-07 NOTE — Progress Notes (Deleted)
46 y.o. G2P2 Married White or Caucasian Not Hispanic or Latino female here for annual exam.      No LMP recorded. (Menstrual status: Other).          Sexually active: {yes no:314532}  The current method of family planning is {contraception:315051}.    Exercising: {yes no:314532}  {types:19826} Smoker:  {YES P5382123  Health Maintenance: Pap:  09/14/2019 WNL, 02/07/17 WNL  History of abnormal Pap:  {YES NO:22349} MMG:  11/16/21 Bi-rads 3 probably benign  BMD:   none  Colonoscopy: none  TDaP:  12/24/12 Gardasil: none    reports that she has never smoked. She has never used smokeless tobacco. She reports that she does not drink alcohol and does not use drugs.  Past Medical History:  Diagnosis Date   Allergy    Anxiety    Asthma    Cancer ([place])    thyroid   Dysplastic nevi    excised lesions: Left UE, Right LE   Pneumonia    hx   Thyroid mass     Past Surgical History:  Procedure Laterality Date   G2P2     INTRAUTERINE DEVICE INSERTION  04/28/2018   Mirena   KNEE SURGERY     Arthroscopic   orthodontic     THYROIDECTOMY N/A 12/01/2015   Procedure: TOTAL THYROIDECTOMY;  Surgeon: Armandina Gemma, MD;  Location: [facility];  Service: General;  Laterality: N/A;    Current Outpatient Medications  Medication Sig Dispense Refill   Cetirizine HCl (ZYRTEC PO) Take 1 tablet by mouth daily.      Cholecalciferol (VITAMIN D PO) Take by mouth.     EPIPEN 2-PAK 0.3 MG/0.3ML SOAJ injection inject as directed if needed for SEVERE ALLERGIC REACTION  0   escitalopram (LEXAPRO) 5 MG tablet Take 1 tablet (5 mg total) by mouth daily. 90 tablet 1   esomeprazole (NEXIUM) 20 MG capsule Take 20 mg by mouth daily at 12 noon.     fluticasone (FLONASE) 50 MCG/ACT nasal spray Place 1 spray into both nostrils 2 (two) times daily.      IRON PO Take by mouth.     levothyroxine (SYNTHROID, LEVOTHROID) 125 MCG tablet Take 125 mcg by mouth daily before breakfast.     Melatonin 1 MG CAPS Take 1 tablet by mouth at  bedtime.     Multiple Vitamin (MULTIVITAMIN) tablet Take 1 tablet by mouth daily.     nitrofurantoin, macrocrystal-monohydrate, (MACROBID) 100 MG capsule Take 100 mg by mouth 2 (two) times daily.     QVAR REDIHALER 80 MCG/ACT inhaler INHALE 1 PUFF INTO LUNGS DAILY 31.8 g 1   VENTOLIN HFA 108 (90 Base) MCG/ACT inhaler INHALE 1 PUFF BY MOUTH EVERY 6 HOURS AS NEEDED 18 g 3   No current facility-administered medications for this visit.    Family History  Problem Relation Age of Onset   Cancer Father        melanoma   Peptic Ulcer Disease Father    Peptic Ulcer Disease Mother    Hypertension Brother    Hypertension Paternal Grandfather    Heart disease Paternal Grandfather    Cancer Paternal Grandmother        LUNG    Breast cancer Maternal Grandmother        70's   Thyroid disease Neg Hx    Colon cancer Neg Hx    Stomach cancer Neg Hx    Esophageal cancer Neg Hx    Inflammatory bowel disease Neg Hx  Liver disease Neg Hx    Rectal cancer Neg Hx    Pancreatic cancer Neg Hx     Review of Systems  Exam:   There were no vitals taken for this visit.  Weight change: '@WEIGHTCHANGE'$ @ Height:      Ht Readings from Last 3 Encounters:  07/12/20 '5\' 10"'$  (1.778 m)  07/07/20 '5\' 10"'$  (1.778 m)  07/01/20 '5\' 10"'$  (1.778 m)    General appearance: alert, cooperative and appears stated age Head: Normocephalic, without obvious abnormality, atraumatic Neck: no adenopathy, supple, symmetrical, trachea midline and thyroid {CHL AMB PHY EX THYROID NORM DEFAULT:[phone]::"normal to inspection and palpation"} Lungs: clear to auscultation bilaterally Cardiovascular: regular rate and rhythm Breasts: {Exam; breast:13139::"normal appearance, no masses or tenderness"} Abdomen: soft, non-tender; non distended,  no masses,  no organomegaly Extremities: extremities normal, atraumatic, no cyanosis or edema Skin: Skin color, texture, turgor normal. No rashes or lesions Lymph nodes: Cervical, supraclavicular,  and axillary nodes normal. No abnormal inguinal nodes palpated Neurologic: Grossly normal   Pelvic: External genitalia:  no lesions              Urethra:  normal appearing urethra with no masses, tenderness or lesions              Bartholins and Skenes: normal                 Vagina: normal appearing vagina with normal color and discharge, no lesions              Cervix: {CHL AMB PHY EX CERVIX NORM DEFAULT:[phone]::"no lesions"}               Bimanual Exam:  Uterus:  {CHL AMB PHY EX UTERUS NORM [phone]::"normal size, contour, position, consistency, mobility, non-tender"}              Adnexa: {CHL AMB PHY EX ADNEXA NO MASS DEFAULT:[phone]::"no mass, fullness, tenderness"}               Rectovaginal: Confirms               Anus:  normal sphincter tone, no lesions  *** chaperoned for the exam.  A:  Well Woman with normal exam  P:

## 2022-02-15 ENCOUNTER — Ambulatory Visit: Payer: Self-pay | Admitting: Obstetrics and Gynecology

## 2022-04-13 DIAGNOSIS — E890 Postprocedural hypothyroidism: Secondary | ICD-10-CM | POA: Diagnosis not present

## 2022-04-25 DIAGNOSIS — B9689 Other specified bacterial agents as the cause of diseases classified elsewhere: Secondary | ICD-10-CM | POA: Diagnosis not present

## 2022-04-25 DIAGNOSIS — J069 Acute upper respiratory infection, unspecified: Secondary | ICD-10-CM | POA: Diagnosis not present

## 2022-04-25 DIAGNOSIS — J329 Chronic sinusitis, unspecified: Secondary | ICD-10-CM | POA: Diagnosis not present

## 2022-05-16 DIAGNOSIS — J069 Acute upper respiratory infection, unspecified: Secondary | ICD-10-CM | POA: Diagnosis not present

## 2022-05-16 DIAGNOSIS — U071 COVID-19: Secondary | ICD-10-CM | POA: Diagnosis not present

## 2022-06-18 ENCOUNTER — Ambulatory Visit: Payer: Self-pay | Admitting: Radiology

## 2022-06-21 ENCOUNTER — Ambulatory Visit: Payer: Self-pay | Admitting: Obstetrics and Gynecology

## 2022-06-22 ENCOUNTER — Ambulatory Visit (INDEPENDENT_AMBULATORY_CARE_PROVIDER_SITE_OTHER): Payer: 59 | Admitting: Obstetrics and Gynecology

## 2022-06-22 ENCOUNTER — Encounter: Payer: Self-pay | Admitting: Obstetrics and Gynecology

## 2022-06-22 VITALS — BP 122/80 | HR 90 | Resp 14

## 2022-06-22 DIAGNOSIS — N898 Other specified noninflammatory disorders of vagina: Secondary | ICD-10-CM | POA: Diagnosis not present

## 2022-06-22 DIAGNOSIS — C73 Malignant neoplasm of thyroid gland: Secondary | ICD-10-CM | POA: Diagnosis not present

## 2022-06-22 DIAGNOSIS — R300 Dysuria: Secondary | ICD-10-CM | POA: Diagnosis not present

## 2022-06-22 DIAGNOSIS — E890 Postprocedural hypothyroidism: Secondary | ICD-10-CM | POA: Diagnosis not present

## 2022-06-22 LAB — URINALYSIS, COMPLETE
Bilirubin Urine: NEGATIVE
Glucose, UA: NEGATIVE
Hgb urine dipstick: NEGATIVE
Hyaline Cast: NONE SEEN /LPF
Ketones, ur: NEGATIVE
Leukocytes,Ua: NEGATIVE
Nitrite: NEGATIVE
Protein, ur: NEGATIVE
Specific Gravity, Urine: 1.023 (ref 1.001–1.035)
pH: 5.5 (ref 5.0–8.0)

## 2022-06-22 LAB — WET PREP FOR TRICH, YEAST, CLUE

## 2022-06-22 MED ORDER — BETAMETHASONE VALERATE 0.1 % EX OINT: 1.0000 | TOPICAL_OINTMENT | Freq: Two times a day (BID) | CUTANEOUS | 0 refills | Status: DC

## 2022-06-22 NOTE — Progress Notes (Signed)
GYNECOLOGY  VISIT   HPI: 46 y.o.   Married White or Caucasian Not Hispanic or Latino  female   G2P2 with Patient's last menstrual period was 06/19/2022.   here for dysuria  2 weeks ago she had a little bump on her labia majora, felt it was a zit and tried to pop it. It got swollen and uncomfortable. She self treated with a Z Pack.   She has started to have some menstrual changes and vaginal dryness.  She had intercourse a few weeks ago, felt uncomfortable afterwards.  Some burning with urination externally. The following week she felt like the burning was internal. She did an E Visit and was treated for a UTI.  Current bladder symptoms are better.  Now with a low grade vaginal irritation. She was also treated with an antifungal.   Same partner x 2 years. No STD concerns.   GYNECOLOGIC HISTORY: Patient's last menstrual period was 06/19/2022. 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   Generalized anxiety disorder 03/31/2015   Vitamin D deficiency 03/23/2015   Routine health maintenance 12/25/2012   PMS (premenstrual syndrome) 07/07/2012   ALLERGIC RHINITIS 11/04/2007   Asthma 11/04/2007    Past Medical History:  Diagnosis Date   Allergy    Anxiety    Asthma    Cancer ([place])    thyroid   Dysplastic nevi    excised lesions: Left UE, Right LE   Pneumonia    hx   Thyroid mass     Past Surgical History:  Procedure Laterality Date   G2P2     INTRAUTERINE DEVICE INSERTION  04/28/2018   Mirena   KNEE SURGERY     Arthroscopic    orthodontic     THYROIDECTOMY N/A 12/01/2015   Procedure: TOTAL THYROIDECTOMY;  Surgeon: Armandina Gemma, MD;  Location: [facility];  Service: General;  Laterality: N/A;    Current Outpatient Medications  Medication Sig Dispense Refill   Cetirizine HCl (ZYRTEC PO) Take 1 tablet by mouth daily.      Cholecalciferol (VITAMIN D PO) Take by mouth.     EPIPEN 2-PAK 0.3 MG/0.3ML SOAJ injection inject as directed if needed for SEVERE ALLERGIC REACTION  0   esomeprazole (NEXIUM) 20 MG capsule Take 20 mg by mouth daily at 12 noon.     fluticasone (FLONASE) 50 MCG/ACT nasal spray Place 1 spray into both nostrils 2 (two) times daily.      levothyroxine (SYNTHROID) 150 MCG tablet Take 150 mcg by mouth daily before breakfast.     Melatonin 1 MG CAPS Take 1 tablet by mouth at bedtime.     Multiple Vitamin (MULTIVITAMIN) tablet Take 1 tablet by mouth daily.     QVAR REDIHALER 80 MCG/ACT inhaler INHALE 1 PUFF INTO LUNGS DAILY 31.8 g 1  VENTOLIN HFA 108 (90 Base) MCG/ACT inhaler INHALE 1 PUFF BY MOUTH EVERY 6 HOURS AS NEEDED 18 g 3   No current facility-administered medications for this visit.     ALLERGIES: Doxycycline, Fish allergy, Other, Sertraline, Shellfish allergy, and Zoloft [sertraline hcl]  Family History  Problem Relation Age of Onset   Cancer Father        melanoma   Peptic Ulcer Disease Father    Peptic Ulcer Disease Mother    Hypertension Brother    Hypertension Paternal Grandfather    Heart disease Paternal Grandfather    Cancer Paternal Grandmother        LUNG    Breast cancer Maternal Grandmother        70's   Thyroid disease Neg Hx    Colon cancer Neg Hx    Stomach cancer Neg Hx    Esophageal cancer Neg Hx    Inflammatory bowel disease Neg Hx    Liver disease Neg Hx    Rectal cancer Neg Hx    Pancreatic cancer Neg Hx     Social History   Socioeconomic History   Marital status: Married    Spouse name: Not on file   Number of children: 2   Years of education: 12+   Highest  education level: Not on file  Occupational History   Occupation: Interior and spatial designer: SELF  Tobacco Use   Smoking status: Never   Smokeless tobacco: Never  Vaping Use   Vaping Use: Never used  Substance and Sexual Activity   Alcohol use: No   Drug use: No   Sexual activity: Yes    Partners: Male    Birth control/protection: I.U.D.    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 No h/o abuse.   Social Determinants of Health   Financial Resource Strain: Not on file  Food Insecurity: Not on file  Transportation Needs: Not on file  Physical Activity: Not on file  Stress: Not on file  Social Connections: Not on file  Intimate Partner Violence: Not on file    ROS  PHYSICAL EXAMINATION:    BP 122/80 (BP Location: Right Arm, Patient Position: Sitting, Cuff Size: Normal)   Pulse 90   Resp 14   LMP 06/19/2022     General appearance: alert, cooperative and appears stated age  Pelvic: External genitalia:  no lesions              Urethra:  normal appearing urethra with no masses, tenderness or lesions              Bartholins and Skenes: normal                 Vagina: normal appearing vagina with normal color and discharge, no lesions, moderate blood              Cervix: no cervical motion tenderness and no lesions              Bimanual Exam:  Uterus:  normal size, contour, position, consistency, mobility, non-tender              Adnexa: no mass, fullness, tenderness              Bladder: not tender  Chaperone was present for exam.  1. Dysuria  Was just treated for a UTI, symptoms currently improved - Urinalysis, Complete  2. Vaginal irritation Normal exam and wet prep. Discussed vulvar skin care - WET PREP FOR TRICH, YEAST, CLUE - betamethasone valerate ointment  (VALISONE) 0.1 %; Apply 1 Application topically 2 (two) times daily. Can use for up to 2 weeks as needed  Dispense: 30 g; Refill: 0 -Given samples of uberlube for use during IC.

## 2022-06-25 DIAGNOSIS — Z85850 Personal history of malignant neoplasm of thyroid: Secondary | ICD-10-CM | POA: Diagnosis not present

## 2022-06-25 DIAGNOSIS — Z79890 Hormone replacement therapy (postmenopausal): Secondary | ICD-10-CM | POA: Diagnosis not present

## 2022-06-25 DIAGNOSIS — E890 Postprocedural hypothyroidism: Secondary | ICD-10-CM | POA: Diagnosis not present

## 2022-06-26 ENCOUNTER — Encounter: Payer: Self-pay | Admitting: Obstetrics and Gynecology

## 2022-07-01 ENCOUNTER — Encounter: Payer: Self-pay | Admitting: Obstetrics and Gynecology

## 2022-07-02 ENCOUNTER — Encounter: Payer: Self-pay | Admitting: Obstetrics and Gynecology

## 2022-07-03 ENCOUNTER — Ambulatory Visit (INDEPENDENT_AMBULATORY_CARE_PROVIDER_SITE_OTHER): Payer: 59 | Admitting: Radiology

## 2022-07-03 VITALS — BP 114/84

## 2022-07-03 DIAGNOSIS — R300 Dysuria: Secondary | ICD-10-CM | POA: Diagnosis not present

## 2022-07-03 DIAGNOSIS — B9689 Other specified bacterial agents as the cause of diseases classified elsewhere: Secondary | ICD-10-CM

## 2022-07-03 DIAGNOSIS — N760 Acute vaginitis: Secondary | ICD-10-CM

## 2022-07-03 LAB — WET PREP FOR TRICH, YEAST, CLUE

## 2022-07-03 MED ORDER — METRONIDAZOLE 0.75 % VA GEL: 1.0000 | Freq: Two times a day (BID) | VAGINAL | 0 refills | Status: DC

## 2022-07-03 MED ORDER — METRONIDAZOLE 0.75 % VA GEL: 1.0000 | Freq: Every day | VAGINAL | 0 refills | Status: DC

## 2022-07-03 MED ORDER — SULFAMETHOXAZOLE-TRIMETHOPRIM 800-160 MG PO TABS: 1.0000 | ORAL_TABLET | Freq: Two times a day (BID) | ORAL | 0 refills | Status: DC

## 2022-07-03 NOTE — Progress Notes (Signed)
      Subjective: Amy Bautista is a 46 y.o. female who complains of dysuria, frequency, urgency (treated for UTI 3 weeks ago--online ov with Macrobid, urine recheck negative 06/22/22).  Also complains of burning around urethra.  Took #1 Diflucan and Monistat 1 06/30/22.   - Sexually active with 1 female partner(s) - Last sexual encounter: 3 weeks ago -Contraception: IUD  Review of Systems  All other systems reviewed and are negative.   Past Medical History:  Diagnosis Date   Allergy    Anxiety    Asthma    Cancer ([place])    thyroid   Dysplastic nevi    excised lesions: Left UE, Right LE   Pneumonia    hx   Thyroid mass       Objective:  Today's Vitals   07/03/22 1116  BP: 114/84   There is no height or weight on file to calculate BMI.   -General: no acute distress -Vulva: without lesions or discharge -Vagina: discharge present, aptima swab and wet prep obtained -Cervix: no lesion or discharge, no CMT -Perineum: no lesions -Uterus: Mobile, non tender -Adnexa: no masses or tenderness -CVAT: no  Urine dipstick shows positive for RBC's and positive for leukocytes.  Micro exam: 10-20 WBC's per HPF, 0-2 RBC's per HPF, and few+ bacteria.   Microscopic wet-mount exam shows clue cells.   Chaperone offered and declined.  Assessment:/Plan:  1. Dysuria  - Urinalysis,Complete w/RFL Culture - sulfamethoxazole-trimethoprim (BACTRIM DS) 800-160 MG tablet; Take 1 tablet by mouth 2 (two) times daily.  Dispense: 6 tablet; Refill: 0  2. BV (bacterial vaginosis)  - metroNIDAZOLE (METROGEL) 0.75 % vaginal gel; Place 1 Applicatorful vaginally at bedtime.  Dispense: 70 g; Refill: 0      Will contact patient with results of testing completed today. Avoid intercourse until symptoms are resolved. Safe sex encouraged. Avoid the use of soaps or perfumed products in the peri area. Avoid tub baths and sitting in sweaty or wet clothing for prolonged periods of time.

## 2022-07-03 NOTE — Telephone Encounter (Signed)
Patient was seen on 07/03/22 with Amy Bautista.

## 2022-07-03 NOTE — Telephone Encounter (Signed)
Patient has visit with Jami today on 07/03/22

## 2022-07-05 LAB — URINALYSIS, COMPLETE W/RFL CULTURE
Bilirubin Urine: NEGATIVE
Glucose, UA: NEGATIVE
Hyaline Cast: NONE SEEN /LPF
Ketones, ur: NEGATIVE
Nitrites, Initial: NEGATIVE
Protein, ur: NEGATIVE
Specific Gravity, Urine: 1.002 (ref 1.001–1.035)
pH: 6 (ref 5.0–8.0)

## 2022-07-05 LAB — URINE CULTURE
MICRO NUMBER:: 13969084
Result:: NO GROWTH
SPECIMEN QUALITY:: ADEQUATE

## 2022-07-05 LAB — CULTURE INDICATED

## 2022-07-09 ENCOUNTER — Encounter: Payer: Self-pay | Admitting: Obstetrics and Gynecology

## 2022-07-09 NOTE — Progress Notes (Unsigned)
GYNECOLOGY  VISIT   HPI: 46 y.o.   divorced White or Caucasian Not Hispanic or Latino  female   G2P2 with Patient's last menstrual period was 06/19/2022.   here for vaginal irritation. She feels like she is getting better. The itching and burning is getting better.   She was treated via an e visit in early 9/23 for a UTI and yeast.  She was seen on 9/15 had a normal exam, wet prep and urine. She was seen again on 07/03/22, wet prep was + for clue and urine culture was negative. She was treated with metrogel and prior to the culture returning she was treated with bactrim for urinary c/o.  She finished the metrogel 5 days ago. Currently 80-90% better.  She was having burning around her urethra when she was voiding and then after voiding, vaginal burning. The feeling around her urethra is gone.   Currently feels a little tender in her vagina, notices it a little more after she voids. She had sex the day after starting to use the gel and that was uncomfortable afterwards.   Feels like she has turned a corner, just not back to normal.   She is sexually active, monogamous, same partner x 1.5 years. No dyspareunia.   Cycles have changed from monthly to q 3-5 weeks, one skipped cycle in the last year.    GYNECOLOGIC HISTORY: Patient's last menstrual period was 06/19/2022. 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   Generalized anxiety disorder 03/31/2015   Vitamin D deficiency 03/23/2015   Routine health maintenance 12/25/2012   PMS (premenstrual  syndrome) 07/07/2012   ALLERGIC RHINITIS 11/04/2007   Asthma 11/04/2007    Past Medical History:  Diagnosis Date   Allergy    Anxiety    Asthma    Cancer ([place])    thyroid   Dysplastic nevi    excised lesions: Left UE, Right LE   Pneumonia    hx   Thyroid mass     Past Surgical History:  Procedure Laterality Date   G2P2     INTRAUTERINE DEVICE INSERTION  04/28/2018   Mirena   KNEE SURGERY     Arthroscopic   orthodontic     THYROIDECTOMY N/A 12/01/2015   Procedure: TOTAL THYROIDECTOMY;  Surgeon: Armandina Gemma, MD;  Location: [facility];  Service: General;  Laterality: N/A;    Current Outpatient Medications  Medication Sig Dispense Refill   betamethasone valerate ointment (VALISONE) 0.1 % Apply 1 Application topically 2 (two) times daily. Can use for up to 2 weeks as needed 30 g 0   Cetirizine HCl (ZYRTEC PO) Take 1 tablet by mouth daily.      Cholecalciferol (VITAMIN D PO) Take by mouth.     EPIPEN 2-PAK 0.3 MG/0.3ML SOAJ injection  inject as directed if needed for SEVERE ALLERGIC REACTION  0   fluconazole (DIFLUCAN) 150 MG tablet Take by mouth.     fluticasone (FLONASE) 50 MCG/ACT nasal spray Place 1 spray into both nostrils 2 (two) times daily.      levothyroxine (SYNTHROID) 150 MCG tablet Take 150 mcg by mouth daily before breakfast.     Melatonin 1 MG CAPS Take 1 tablet by mouth at bedtime.     metroNIDAZOLE (METROGEL) 0.75 % vaginal gel Place 1 Applicatorful vaginally at bedtime. 70 g 0   Multiple Vitamin (MULTIVITAMIN) tablet Take 1 tablet by mouth daily.     QVAR REDIHALER 80 MCG/ACT inhaler INHALE 1 PUFF INTO LUNGS DAILY 31.8 g 1   sulfamethoxazole-trimethoprim (BACTRIM DS) 800-160 MG tablet Take 1 tablet by mouth 2 (two) times daily. 6 tablet 0   VENTOLIN HFA 108 (90 Base) MCG/ACT inhaler INHALE 1 PUFF BY MOUTH EVERY 6 HOURS AS NEEDED 18 g 3   No current facility-administered medications for this visit.     ALLERGIES: Doxycycline, Fish allergy, Other, Sertraline,  Shellfish allergy, and Zoloft [sertraline hcl]  Family History  Problem Relation Age of Onset   Cancer Father        melanoma   Peptic Ulcer Disease Father    Peptic Ulcer Disease Mother    Hypertension Brother    Hypertension Paternal Grandfather    Heart disease Paternal Grandfather    Cancer Paternal Grandmother        LUNG    Breast cancer Maternal Grandmother        70's   Thyroid disease Neg Hx    Colon cancer Neg Hx    Stomach cancer Neg Hx    Esophageal cancer Neg Hx    Inflammatory bowel disease Neg Hx    Liver disease Neg Hx    Rectal cancer Neg Hx    Pancreatic cancer Neg Hx     Social History   Socioeconomic History   Marital status: Married    Spouse name: Not on file   Number of children: 2   Years of education: 12+   Highest education level: Not on file  Occupational History   Occupation: Interior and spatial designer: SELF  Tobacco Use   Smoking status: Never   Smokeless tobacco: Never  Vaping Use   Vaping Use: Never used  Substance and Sexual Activity   Alcohol use: No   Drug use: No   Sexual activity: Yes    Partners: Male    Birth control/protection: I.U.D.    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 No h/o abuse.   Social Determinants of Health   Financial Resource Strain: Not on file  Food Insecurity: Not on file  Transportation Needs: Not on file  Physical Activity: Not on file  Stress: Not on file  Social Connections: Not on file  Intimate Partner Violence: Not on file    Review of Systems  All other systems reviewed and are negative.   PHYSICAL EXAMINATION:    LMP 06/19/2022     General appearance: alert, cooperative and appears stated age  Pelvic: External genitalia:  no lesions, no erythema              Urethra:   normal appearing urethra with no masses, tenderness  or lesions              Bartholins and Skenes: normal                 Vagina: normal appearing vagina with a slight increase in watery, yellow vaginal d/c              Cervix: no lesions               Chaperone was present for exam.  1. Subacute vaginitis Completed treatment for BV 5 days ago, 80-90% better - WET PREP FOR TRICH, YEAST, CLUE: mod WBC, mod bacteria, no yeast, no clue cells, no trich - SureSwab Advanced Vaginitis Plus,TMA  2. Screening examination for STD (sexually transmitted disease) - SureSwab Advanced Vaginitis Plus,TMA

## 2022-07-10 ENCOUNTER — Telehealth: Payer: Self-pay

## 2022-07-10 NOTE — Telephone Encounter (Addendum)
Patient is scheduled for OV for vag itching/dischg for 07/12/22 at 2:30pm with Dr. Talbert Nan. Per Miranda at front desk this is first available.    Was prescribed Septra DS and Metrogel on 07/03/22 by Wende Crease, NP.  "patient stating she has had increased itching and would like an RX called in for diflucan before next appt."

## 2022-07-10 NOTE — Telephone Encounter (Signed)
You can add her to my schedule at 2:15 on 07/11/22, so she doesn't have to wait.

## 2022-07-11 NOTE — Telephone Encounter (Signed)
Left detailed message in voice mail offering appt today. Asked her to call back and let us know so we can place her on schedule.

## 2022-07-12 ENCOUNTER — Encounter: Payer: Self-pay | Admitting: Obstetrics and Gynecology

## 2022-07-12 ENCOUNTER — Ambulatory Visit (INDEPENDENT_AMBULATORY_CARE_PROVIDER_SITE_OTHER): Payer: 59 | Admitting: Obstetrics and Gynecology

## 2022-07-12 VITALS — BP 118/72 | HR 66

## 2022-07-12 DIAGNOSIS — Z113 Encounter for screening for infections with a predominantly sexual mode of transmission: Secondary | ICD-10-CM

## 2022-07-12 DIAGNOSIS — N761 Subacute and chronic vaginitis: Secondary | ICD-10-CM | POA: Diagnosis not present

## 2022-07-12 DIAGNOSIS — R69 Illness, unspecified: Secondary | ICD-10-CM | POA: Diagnosis not present

## 2022-07-12 LAB — WET PREP FOR TRICH, YEAST, CLUE

## 2022-07-12 NOTE — Telephone Encounter (Signed)
Patient did call back and say she was unable to come 07/11/22 and would keep her 07/12/22 appt.

## 2022-07-13 LAB — SURESWAB® ADVANCED VAGINITIS PLUS,TMA
C. trachomatis RNA, TMA: NOT DETECTED
CANDIDA SPECIES: NOT DETECTED
Candida glabrata: NOT DETECTED
N. gonorrhoeae RNA, TMA: NOT DETECTED
SURESWAB(R) ADV BACTERIAL VAGINOSIS(BV),TMA: NEGATIVE
TRICHOMONAS VAGINALIS (TV),TMA: NOT DETECTED

## 2022-07-14 ENCOUNTER — Encounter: Payer: Self-pay | Admitting: Obstetrics and Gynecology

## 2022-07-16 ENCOUNTER — Other Ambulatory Visit: Payer: Self-pay | Admitting: Obstetrics and Gynecology

## 2022-07-17 ENCOUNTER — Encounter: Payer: Self-pay | Admitting: Obstetrics and Gynecology

## 2022-07-25 ENCOUNTER — Encounter: Payer: Self-pay | Admitting: Obstetrics and Gynecology

## 2022-07-25 NOTE — Telephone Encounter (Signed)
She sent this same message to Dr. Talbert Nan and I replied that Dr. Talbert Nan out this week and another provider would be happy to see her but that OV recommended. I did not see that there were two messages until after I replied to the first one.

## 2022-07-26 NOTE — Telephone Encounter (Signed)
D/c the AZO

## 2022-07-27 ENCOUNTER — Ambulatory Visit: Payer: 59 | Admitting: Radiology

## 2022-07-27 VITALS — BP 124/76

## 2022-07-27 DIAGNOSIS — N9089 Other specified noninflammatory disorders of vulva and perineum: Secondary | ICD-10-CM

## 2022-07-27 DIAGNOSIS — R300 Dysuria: Secondary | ICD-10-CM | POA: Diagnosis not present

## 2022-07-27 DIAGNOSIS — B3731 Acute candidiasis of vulva and vagina: Secondary | ICD-10-CM | POA: Diagnosis not present

## 2022-07-27 MED ORDER — FLUCONAZOLE 150 MG PO TABS: 150.0000 mg | ORAL_TABLET | ORAL | 0 refills | Status: DC

## 2022-07-27 MED ORDER — ESTRADIOL 0.1 MG/GM VA CREA: 1.0000 g | TOPICAL_CREAM | VAGINAL | 3 refills | Status: DC

## 2022-07-27 NOTE — Progress Notes (Signed)
      Subjective: Amy Bautista is a 46 y.o. female who complains of  labial irritation, discomfort with voiding, tenderness with intercourse.  Symptoms have persisted since last office visit 07/03/22.   Review of Systems  All other systems reviewed and are negative.   Past Medical History:  Diagnosis Date   Allergy    Anxiety    Asthma    Cancer ([place])    thyroid   Dysplastic nevi    excised lesions: Left UE, Right LE   Pneumonia    hx   Thyroid mass       Objective:  Today's Vitals   07/27/22 1058  BP: 124/76   There is no height or weight on file to calculate BMI.   -General: no acute distress -Vulva: without lesions or discharge -Vagina: Mildly atrophic, thin yellow discharge present, wet prep obtained -Cervix: no lesion or discharge, no CMT -Perineum: no lesions -Uterus: Mobile, non tender -Adnexa: no masses or tenderness  Urine dipstick shows positive for leukocytes.  Micro exam: negative for WBC's or RBC's.  Microscopic wet-mount exam shows hyphae.   Chaperone offered and declined.  Assessment:/Plan:   1. Labial irritation  - WET PREP FOR TRICH, YEAST, CLUE - estradiol (ESTRACE VAGINAL) 0.1 MG/GM vaginal cream; Place 1 g vaginally 3 (three) times a week.  Dispense: 42.5 g; Refill: 3  2. Dysuria Reassured neg u/a - Urinalysis,Complete w/RFL Culture  3. Yeast vaginitis - fluconazole (DIFLUCAN) 150 MG tablet; Take 1 tablet (150 mg total) by mouth every 3 (three) days.  Dispense: 2 tablet; Refill: 0     Will contact patient with results of testing completed today. Avoid intercourse until symptoms are resolved. Safe sex encouraged. Avoid the use of soaps or perfumed products in the peri area. Avoid tub baths and sitting in sweaty or wet clothing for prolonged periods of time.

## 2022-07-29 LAB — URINE CULTURE
MICRO NUMBER:: 14079434
SPECIMEN QUALITY:: ADEQUATE

## 2022-07-29 LAB — URINALYSIS, COMPLETE W/RFL CULTURE
Bacteria, UA: NONE SEEN /HPF
Bilirubin Urine: NEGATIVE
Glucose, UA: NEGATIVE
Hgb urine dipstick: NEGATIVE
Hyaline Cast: NONE SEEN /LPF
Ketones, ur: NEGATIVE
Nitrites, Initial: NEGATIVE
Protein, ur: NEGATIVE
Specific Gravity, Urine: 1.015 (ref 1.001–1.035)
pH: 7.5 (ref 5.0–8.0)

## 2022-07-29 LAB — CULTURE INDICATED

## 2022-08-06 LAB — WET PREP FOR TRICH, YEAST, CLUE

## 2022-08-08 ENCOUNTER — Encounter: Payer: Self-pay | Admitting: Radiology

## 2022-08-08 ENCOUNTER — Ambulatory Visit (INDEPENDENT_AMBULATORY_CARE_PROVIDER_SITE_OTHER): Payer: 59 | Admitting: Radiology

## 2022-08-08 ENCOUNTER — Other Ambulatory Visit (HOSPITAL_COMMUNITY)
Admission: RE | Admit: 2022-08-08 | Discharge: 2022-08-08 | Disposition: A | Discharge status: Home / Self Care | Payer: 59 | Source: Ambulatory Visit | Attending: Nurse Practitioner | Admitting: Nurse Practitioner

## 2022-08-08 VITALS — BP 110/70

## 2022-08-08 DIAGNOSIS — Z01419 Encounter for gynecological examination (general) (routine) without abnormal findings: Secondary | ICD-10-CM

## 2022-08-08 DIAGNOSIS — Z1211 Encounter for screening for malignant neoplasm of colon: Secondary | ICD-10-CM

## 2022-08-08 NOTE — Progress Notes (Signed)
   Amy Bautista [place] 1976/06/18 [id number]   History:  46 y.o. G2P2 presents for annual exam. No new gyn concerns.  Gynecologic History Patient's last menstrual period was 07/13/2022 (approximate).   Contraception/Family planning: IUD Sexually active: yes Last Pap: 2020. Results were: normal Last mammogram: 11/16/21. Results were: normal  Obstetric History OB History  Gravida Para Term Preterm AB Living  '2 2       2  '$ SAB IAB Ectopic Multiple Live Births               # Outcome Date GA Lbr Len/2nd Weight Sex Delivery Anes PTL Lv  2 Para           1 Para              The following portions of the patient's history were reviewed and updated as appropriate: allergies, current medications, past family history, past medical history, past social history, past surgical history, and problem list.  Review of Systems Pertinent items noted in HPI and remainder of comprehensive ROS otherwise negative.   Past medical history, past surgical history, family history and social history were all reviewed and documented in the EPIC chart.   Exam:  Vitals:   08/08/22 1356  BP: 110/70   There is no height or weight on file to calculate BMI.  General appearance:  Normal Respiratory  Auscultation:  Clear without wheezing or rhonchi Cardiovascular  Auscultation:  Regular rate, without rubs, murmurs or gallops  Edema/varicosities:  Not grossly evident Abdominal  Soft,nontender, without masses, guarding or rebound.  Liver/spleen:  No organomegaly noted  Hernia:  None appreciated  Skin  Inspection:  Grossly normal Breasts: Examined lying and sitting.   Right: Without masses, retractions, nipple discharge or axillary adenopathy.   Left: Without masses, retractions, nipple discharge or axillary adenopathy. Genitourinary   Inguinal/mons:  Normal without inguinal adenopathy  External genitalia:  Normal appearing vulva with no masses, tenderness, or lesions  BUS/Urethra/Skene's glands:   Normal without masses or exudate  Vagina:  Normal appearing with normal color and discharge, no lesions  Cervix:  Normal appearing without discharge or lesions  Uterus:  Normal in size, shape and contour.  Mobile, nontender  Adnexa/parametria:     Rt: Normal in size, without masses or tenderness.   Lt: Normal in size, without masses or tenderness.  Anus and perineum: Normal   Patient informed chaperone available to be present for breast and pelvic exam. Patient has requested no chaperone to be present. Patient has been advised what will be completed during breast and pelvic exam.   Assessment/Plan:   1. Well woman exam with routine gynecological exam - Cytology - PAP( [facility])  2. Screening for colon cancer - Cologuard    Discussed SBE, colonoscopy and DEXA screening as directed/appropriate. Recommend 146mns of exercise weekly, including weight bearing exercise. Encouraged the use of seatbelts and sunscreen. Return in 1 year for annual or as needed.   CRubbie BattiestB WHNP-BC 2:29 PM 08/08/2022

## 2022-08-09 ENCOUNTER — Ambulatory Visit: Payer: 59 | Admitting: Nurse Practitioner

## 2022-08-10 LAB — CYTOLOGY - PAP
Comment: NEGATIVE
Diagnosis: NEGATIVE
High risk HPV: NEGATIVE

## 2022-08-23 DIAGNOSIS — Z23 Encounter for immunization: Secondary | ICD-10-CM | POA: Diagnosis not present

## 2022-10-10 ENCOUNTER — Encounter: Payer: Self-pay | Admitting: Obstetrics & Gynecology

## 2022-10-10 ENCOUNTER — Ambulatory Visit: Payer: 59 | Admitting: Obstetrics & Gynecology

## 2022-10-10 ENCOUNTER — Ambulatory Visit: Payer: 59 | Admitting: Obstetrics and Gynecology

## 2022-10-10 VITALS — BP 110/74 | HR 88 | Temp 98.0°F

## 2022-10-10 DIAGNOSIS — N898 Other specified noninflammatory disorders of vagina: Secondary | ICD-10-CM

## 2022-10-10 DIAGNOSIS — R309 Painful micturition, unspecified: Secondary | ICD-10-CM | POA: Diagnosis not present

## 2022-10-10 LAB — WET PREP FOR TRICH, YEAST, CLUE

## 2022-10-10 MED ORDER — FLUCONAZOLE 150 MG PO TABS: 150.0000 mg | ORAL_TABLET | ORAL | 0 refills | Status: AC

## 2022-10-10 NOTE — Progress Notes (Signed)
    Amy Bautista [place] [birth year] [id number]        47 y.o.  G2P2   RP: Pain with urination, frequency and vaginal irritation  HPI: Perimenopausal on Estradiol cream.  Sexually active, STI screen Neg for patient and partner.  C/O pain with urination, urinary frequency and vaginal irritation.  Treated for cystitis/BV at last episode of similar Sxs.  No pelvic pain.  BMs normal.  No fever.   OB History  Gravida Para Term Preterm AB Living  '2 2       2  '$ SAB IAB Ectopic Multiple Live Births               # Outcome Date GA Lbr Len/2nd Weight Sex Delivery Anes PTL Lv  2 Para           1 Para             Past medical history,surgical history, problem list, medications, allergies, family history and social history were all reviewed and documented in the EPIC chart.   Directed ROS with pertinent positives and negatives documented in the history of present illness/assessment and plan.  Exam:  Vitals:   10/10/22 1038  BP: 110/74  Pulse: 88  Temp: 98 F (36.7 C)  TempSrc: Oral  SpO2: 99%   General appearance:  Normal  Abdomen: Normal  Gynecologic exam: Vulva normal.  Speculum:  Cervix/Vagina normal.  Yellowish increased discharge.  Wet prep done.  U/A: Negative, except few yeasts Wet prep:  Yeasts present   Assessment/Plan:  47 y.o. G2P2   1. Pain with urination Perimenopausal on Estradiol cream.  Sexually active, STI screen Neg for patient and partner.  C/O pain with urination, urinary frequency and vaginal irritation.  Treated for cystitis/BV at last episode of similar Sxs.  No pelvic pain.  BMs normal.  No fever.  U/A Negatvie, reassured. - Urinalysis,Complete w/RFL Culture  2. Vaginal irritation Perimenopausal on Estradiol cream.  Sexually active, STI screen Neg for patient and partner.  C/O pain with urination, urinary frequency and vaginal irritation.  Treated for cystitis/BV at last episode of similar Sxs.  No pelvic pain.  BMs normal.  No fever.  Wet prep  confirming a Yeast vaginitis.  Will treat with Fluconazole 150 mg 1 tab PO every other day x 3.  Usage reviewed, prescription sent to pharmacy.  Will use Boric Acid once a week as needed.  Increase yogurt intake. - WET PREP FOR [place], YEAST, CLUE  Other orders - SYNTHROID 137 MCG tablet; Take 137 mcg by mouth daily.  - fluconazole (DIFLUCAN) 150 MG tablet; Take 1 tablet (150 mg total) by mouth every other day for 3 doses.   Princess Bruins MD, 10:53 AM 10/10/2022

## 2022-10-12 LAB — URINALYSIS, COMPLETE W/RFL CULTURE
Bacteria, UA: NONE SEEN /HPF
Bilirubin Urine: NEGATIVE
Casts: NONE SEEN /LPF
Crystals: NONE SEEN /HPF
Glucose, UA: NEGATIVE
Hyaline Cast: NONE SEEN /LPF
Ketones, ur: NEGATIVE
Leukocyte Esterase: NEGATIVE
Nitrites, Initial: NEGATIVE
Protein, ur: NEGATIVE
Specific Gravity, Urine: 1.01 (ref 1.001–1.035)
pH: 7 (ref 5.0–8.0)

## 2022-10-12 LAB — URINE CULTURE
MICRO NUMBER:: 14382573
Result:: NO GROWTH
SPECIMEN QUALITY:: ADEQUATE

## 2022-10-12 LAB — CULTURE INDICATED

## 2022-10-17 ENCOUNTER — Ambulatory Visit: Payer: 59 | Admitting: Radiology

## 2022-10-22 ENCOUNTER — Other Ambulatory Visit: Payer: Self-pay

## 2022-10-22 ENCOUNTER — Encounter: Payer: Self-pay | Admitting: Obstetrics & Gynecology

## 2022-10-22 MED ORDER — FLUCONAZOLE 150 MG PO TABS: ORAL_TABLET | ORAL | 2 refills | Status: DC

## 2022-10-22 NOTE — Telephone Encounter (Signed)
Amy Bruins, MD  You31 minutes ago (2:56 PM)   Can send Fluconazole 150 mg 1 tab every other day x 3.  #3 with refill x 2.

## 2022-11-23 DIAGNOSIS — E890 Postprocedural hypothyroidism: Secondary | ICD-10-CM | POA: Diagnosis not present

## 2022-11-28 DIAGNOSIS — D2272 Melanocytic nevi of left lower limb, including hip: Secondary | ICD-10-CM | POA: Diagnosis not present

## 2022-11-28 DIAGNOSIS — Z85820 Personal history of malignant melanoma of skin: Secondary | ICD-10-CM | POA: Diagnosis not present

## 2022-11-28 DIAGNOSIS — D2262 Melanocytic nevi of left upper limb, including shoulder: Secondary | ICD-10-CM | POA: Diagnosis not present

## 2022-11-28 DIAGNOSIS — D2261 Melanocytic nevi of right upper limb, including shoulder: Secondary | ICD-10-CM | POA: Diagnosis not present

## 2022-11-28 DIAGNOSIS — L821 Other seborrheic keratosis: Secondary | ICD-10-CM | POA: Diagnosis not present

## 2022-11-28 DIAGNOSIS — D2271 Melanocytic nevi of right lower limb, including hip: Secondary | ICD-10-CM | POA: Diagnosis not present

## 2022-11-28 DIAGNOSIS — D225 Melanocytic nevi of trunk: Secondary | ICD-10-CM | POA: Diagnosis not present

## 2022-12-28 DIAGNOSIS — E890 Postprocedural hypothyroidism: Secondary | ICD-10-CM | POA: Diagnosis not present

## 2023-01-01 NOTE — Telephone Encounter (Signed)
FYI. Pt scheduled for 01/03/2023.   Will route to provider for final review and close encounter.

## 2023-01-02 ENCOUNTER — Ambulatory Visit: Payer: 59 | Admitting: Radiology

## 2023-01-02 VITALS — BP 120/68

## 2023-01-02 DIAGNOSIS — N951 Menopausal and female climacteric states: Secondary | ICD-10-CM

## 2023-01-02 DIAGNOSIS — N952 Postmenopausal atrophic vaginitis: Secondary | ICD-10-CM | POA: Diagnosis not present

## 2023-01-02 LAB — WET PREP FOR TRICH, YEAST, CLUE

## 2023-01-02 MED ORDER — PROGESTERONE MICRONIZED 100 MG PO CAPS: 100.0000 mg | ORAL_CAPSULE | Freq: Every day | ORAL | 3 refills | Status: DC

## 2023-01-02 MED ORDER — IMVEXXY MAINTENANCE PACK 10 MCG VA INST: 1.0000 | VAGINAL_INSERT | VAGINAL | 6 refills | Status: DC

## 2023-01-02 MED ORDER — ESTRADIOL 0.025 MG/24HR TD PTTW: 1.0000 | MEDICATED_PATCH | TRANSDERMAL | 3 refills | Status: DC

## 2023-01-02 NOTE — Progress Notes (Signed)
      Subjective: Amy Bautista is a 47 y.o. female who complains of  light vaginal discharge with itching, irritation, no odor. Began after last menses. Has not used  Estradiol vaginal cream after she had her LMP 3 weeks ago.   Wants to discuss perimenopausal symptoms and treatment, mood changes, skipping periods. Tried SSRIs when she had thyroid cancer and didn't have orgasms. Decreased thyroid dose 4 days ago (due to labs showing mild hyperthyroidism). Did not tolerate ortho evra patch well emotionally, did better with nuva ring but it caused vaginal irritation.    Review of Systems  All other systems reviewed and are negative.   Past Medical History:  Diagnosis Date   Allergy    Anxiety    Asthma    Cancer ([place])    thyroid   Dysplastic nevi    excised lesions: Left UE, Right LE   Pneumonia    hx   Thyroid mass       Objective:  Today's Vitals   01/02/23 0950  BP: 120/68   There is no height or weight on file to calculate BMI.   -General: no acute distress -Vulva: without lesions or discharge -Vagina: scant stringy discharge present, wet prep obtained -Cervix: no lesion or discharge, no CMT -Perineum: no lesions  Microscopic wet-mount exam shows white blood cells.   Chaperone offered and declined.  Assessment:/Plan:   1. Perimenopausal symptoms  - estradiol (VIVELLE-DOT) 0.025 MG/24HR; Place 1 patch onto the skin 2 (two) times a week.  Dispense: 8 patch; Refill: 3 - progesterone (PROMETRIUM) 100 MG capsule; Take 1 capsule (100 mg total) by mouth daily.  Dispense: 30 capsule; Refill: 3  2. Atrophic vaginitis Will change from estrace to imvexxy in hopes of better symptom management with less mess - Estradiol (IMVEXXY MAINTENANCE PACK) 10 MCG INST; Place 1 capsule vaginally 2 (two) times a week.  Dispense: 8 each; Refill: 6 - WET PREP FOR TRICH, YEAST, CLUE; negative    Avoid intercourse until symptoms are resolved. Safe sex encouraged. Avoid the use  of soaps or perfumed products in the peri area. Avoid tub baths and sitting in sweaty or wet clothing for prolonged periods of time.

## 2023-01-03 ENCOUNTER — Ambulatory Visit: Payer: 59 | Admitting: Radiology

## 2023-01-29 ENCOUNTER — Other Ambulatory Visit: Payer: Self-pay | Admitting: Radiology

## 2023-01-29 DIAGNOSIS — N951 Menopausal and female climacteric states: Secondary | ICD-10-CM

## 2023-01-29 MED ORDER — BUPROPION HCL ER (XL) 150 MG PO TB24: 150.0000 mg | ORAL_TABLET | Freq: Every day | ORAL | 0 refills | Status: DC

## 2023-01-29 NOTE — Telephone Encounter (Signed)
Last OV 01/02/23.   Routing to [place] to review and advise.

## 2023-02-11 ENCOUNTER — Other Ambulatory Visit: Payer: Self-pay | Admitting: Nurse Practitioner

## 2023-02-11 DIAGNOSIS — F419 Anxiety disorder, unspecified: Secondary | ICD-10-CM

## 2023-02-11 DIAGNOSIS — N951 Menopausal and female climacteric states: Secondary | ICD-10-CM

## 2023-02-11 MED ORDER — ESCITALOPRAM OXALATE 5 MG PO TABS: 5.0000 mg | ORAL_TABLET | Freq: Every day | ORAL | 1 refills | Status: AC

## 2023-02-20 ENCOUNTER — Other Ambulatory Visit: Payer: Self-pay | Admitting: Nurse Practitioner

## 2023-02-20 DIAGNOSIS — N951 Menopausal and female climacteric states: Secondary | ICD-10-CM

## 2023-02-20 DIAGNOSIS — F419 Anxiety disorder, unspecified: Secondary | ICD-10-CM

## 2023-02-28 DIAGNOSIS — F3281 Premenstrual dysphoric disorder: Secondary | ICD-10-CM | POA: Diagnosis not present

## 2023-02-28 DIAGNOSIS — K219 Gastro-esophageal reflux disease without esophagitis: Secondary | ICD-10-CM | POA: Diagnosis not present

## 2023-02-28 DIAGNOSIS — F411 Generalized anxiety disorder: Secondary | ICD-10-CM | POA: Diagnosis not present

## 2023-02-28 DIAGNOSIS — F331 Major depressive disorder, recurrent, moderate: Secondary | ICD-10-CM | POA: Diagnosis not present

## 2023-02-28 DIAGNOSIS — E039 Hypothyroidism, unspecified: Secondary | ICD-10-CM | POA: Diagnosis not present

## 2023-02-28 DIAGNOSIS — G4700 Insomnia, unspecified: Secondary | ICD-10-CM | POA: Diagnosis not present

## 2023-04-02 DIAGNOSIS — F411 Generalized anxiety disorder: Secondary | ICD-10-CM | POA: Diagnosis not present

## 2023-04-02 DIAGNOSIS — K219 Gastro-esophageal reflux disease without esophagitis: Secondary | ICD-10-CM | POA: Diagnosis not present

## 2023-04-02 DIAGNOSIS — F3281 Premenstrual dysphoric disorder: Secondary | ICD-10-CM | POA: Diagnosis not present

## 2023-04-02 DIAGNOSIS — G4700 Insomnia, unspecified: Secondary | ICD-10-CM | POA: Diagnosis not present

## 2023-04-02 DIAGNOSIS — F331 Major depressive disorder, recurrent, moderate: Secondary | ICD-10-CM | POA: Diagnosis not present

## 2023-06-17 DIAGNOSIS — R5383 Other fatigue: Secondary | ICD-10-CM | POA: Diagnosis not present

## 2023-06-17 DIAGNOSIS — E039 Hypothyroidism, unspecified: Secondary | ICD-10-CM | POA: Diagnosis not present

## 2023-06-20 DIAGNOSIS — E039 Hypothyroidism, unspecified: Secondary | ICD-10-CM | POA: Diagnosis not present

## 2023-06-20 DIAGNOSIS — Z23 Encounter for immunization: Secondary | ICD-10-CM | POA: Diagnosis not present

## 2023-06-20 DIAGNOSIS — K219 Gastro-esophageal reflux disease without esophagitis: Secondary | ICD-10-CM | POA: Diagnosis not present

## 2023-06-20 DIAGNOSIS — G4700 Insomnia, unspecified: Secondary | ICD-10-CM | POA: Diagnosis not present

## 2023-06-20 DIAGNOSIS — F331 Major depressive disorder, recurrent, moderate: Secondary | ICD-10-CM | POA: Diagnosis not present

## 2023-06-20 DIAGNOSIS — F3281 Premenstrual dysphoric disorder: Secondary | ICD-10-CM | POA: Diagnosis not present

## 2023-06-20 DIAGNOSIS — F411 Generalized anxiety disorder: Secondary | ICD-10-CM | POA: Diagnosis not present

## 2023-06-28 ENCOUNTER — Other Ambulatory Visit: Payer: Self-pay | Admitting: Radiology

## 2023-06-28 DIAGNOSIS — N952 Postmenopausal atrophic vaginitis: Secondary | ICD-10-CM

## 2023-06-28 NOTE — Telephone Encounter (Signed)
Med refill request: imvexxy Last AEX: 08/08/22 Next AEX: none Last MMG (if hormonal med) 11/16/21 Refill authorized, sent to provider.

## 2023-07-17 DIAGNOSIS — D2271 Melanocytic nevi of right lower limb, including hip: Secondary | ICD-10-CM | POA: Diagnosis not present

## 2023-07-17 DIAGNOSIS — D2262 Melanocytic nevi of left upper limb, including shoulder: Secondary | ICD-10-CM | POA: Diagnosis not present

## 2023-07-17 DIAGNOSIS — D2272 Melanocytic nevi of left lower limb, including hip: Secondary | ICD-10-CM | POA: Diagnosis not present

## 2023-07-17 DIAGNOSIS — D225 Melanocytic nevi of trunk: Secondary | ICD-10-CM | POA: Diagnosis not present

## 2023-07-17 DIAGNOSIS — L814 Other melanin hyperpigmentation: Secondary | ICD-10-CM | POA: Diagnosis not present

## 2023-07-17 DIAGNOSIS — D2261 Melanocytic nevi of right upper limb, including shoulder: Secondary | ICD-10-CM | POA: Diagnosis not present

## 2023-07-17 DIAGNOSIS — L821 Other seborrheic keratosis: Secondary | ICD-10-CM | POA: Diagnosis not present

## 2023-07-17 DIAGNOSIS — Z85820 Personal history of malignant melanoma of skin: Secondary | ICD-10-CM | POA: Diagnosis not present

## 2023-07-19 DIAGNOSIS — Z114 Encounter for screening for human immunodeficiency virus [HIV]: Secondary | ICD-10-CM | POA: Diagnosis not present

## 2023-07-19 DIAGNOSIS — Z13220 Encounter for screening for lipoid disorders: Secondary | ICD-10-CM | POA: Diagnosis not present

## 2023-07-19 DIAGNOSIS — E039 Hypothyroidism, unspecified: Secondary | ICD-10-CM | POA: Diagnosis not present

## 2023-07-19 DIAGNOSIS — Z0000 Encounter for general adult medical examination without abnormal findings: Secondary | ICD-10-CM | POA: Diagnosis not present

## 2023-07-19 DIAGNOSIS — E663 Overweight: Secondary | ICD-10-CM | POA: Diagnosis not present

## 2023-07-23 DIAGNOSIS — Z0000 Encounter for general adult medical examination without abnormal findings: Secondary | ICD-10-CM | POA: Diagnosis not present

## 2023-08-16 ENCOUNTER — Other Ambulatory Visit: Payer: Self-pay | Admitting: Radiology

## 2023-08-16 DIAGNOSIS — N952 Postmenopausal atrophic vaginitis: Secondary | ICD-10-CM

## 2023-08-16 NOTE — Telephone Encounter (Signed)
Med refill request: estradiol 10 mcg insert Last AEX: 08/08/22 Next AEX: none scheduled Last MMG (if hormonal med) 11/16/21 Refill estradiol 10 mcg Sent to provider for approval or denial.

## 2023-10-22 ENCOUNTER — Other Ambulatory Visit: Payer: Self-pay | Admitting: Radiology

## 2023-10-22 DIAGNOSIS — N952 Postmenopausal atrophic vaginitis: Secondary | ICD-10-CM

## 2023-10-22 NOTE — Telephone Encounter (Signed)
 Med refill request: imvexxy insert Last AEX: 08/08/22 Next AEX: none scheduled Last MMG (if hormonal med) 11/16/21 Refill authorized: Imvexxy insert #24, needs office visit for further refills. Sent to provider for review.

## 2023-11-08 DIAGNOSIS — H6092 Unspecified otitis externa, left ear: Secondary | ICD-10-CM | POA: Diagnosis not present

## 2023-11-08 DIAGNOSIS — J069 Acute upper respiratory infection, unspecified: Secondary | ICD-10-CM | POA: Diagnosis not present

## 2023-12-09 ENCOUNTER — Other Ambulatory Visit: Payer: Self-pay

## 2023-12-09 DIAGNOSIS — N952 Postmenopausal atrophic vaginitis: Secondary | ICD-10-CM

## 2023-12-09 NOTE — Telephone Encounter (Signed)
 Medication refill request: Imvexxy Maintenance pack  Last OV:  01/02/23 Next AEX: not scheduled  Last MMG (if hormonal medication request): 11/16/21 bi-rads 3 probably benign  Refill authorized: please advise

## 2023-12-10 ENCOUNTER — Other Ambulatory Visit (HOSPITAL_COMMUNITY): Payer: Self-pay

## 2023-12-10 ENCOUNTER — Other Ambulatory Visit: Payer: Self-pay

## 2023-12-10 MED ORDER — IMVEXXY MAINTENANCE PACK 10 MCG VA INST
1.0000 | VAGINAL_INSERT | VAGINAL | 0 refills | Status: DC
  Filled 2023-12-10: qty 24, 90d supply, fill #0

## 2023-12-27 ENCOUNTER — Other Ambulatory Visit (HOSPITAL_COMMUNITY): Payer: Self-pay

## 2023-12-27 ENCOUNTER — Other Ambulatory Visit: Payer: Self-pay

## 2024-03-26 ENCOUNTER — Ambulatory Visit (INDEPENDENT_AMBULATORY_CARE_PROVIDER_SITE_OTHER): Admitting: Radiology

## 2024-03-26 ENCOUNTER — Encounter: Payer: Self-pay | Admitting: Radiology

## 2024-03-26 VITALS — BP 102/58 | HR 77 | Ht 70.75 in | Wt 167.0 lb

## 2024-03-26 DIAGNOSIS — Z01419 Encounter for gynecological examination (general) (routine) without abnormal findings: Secondary | ICD-10-CM

## 2024-03-26 DIAGNOSIS — N951 Menopausal and female climacteric states: Secondary | ICD-10-CM | POA: Diagnosis not present

## 2024-03-26 DIAGNOSIS — N952 Postmenopausal atrophic vaginitis: Secondary | ICD-10-CM

## 2024-03-26 DIAGNOSIS — Z1331 Encounter for screening for depression: Secondary | ICD-10-CM | POA: Diagnosis not present

## 2024-03-26 DIAGNOSIS — Z1211 Encounter for screening for malignant neoplasm of colon: Secondary | ICD-10-CM

## 2024-03-26 MED ORDER — ESTRADIOL 0.025 MG/24HR TD PTTW: 1.0000 | MEDICATED_PATCH | TRANSDERMAL | 4 refills | Status: AC

## 2024-03-26 MED ORDER — IMVEXXY MAINTENANCE PACK 10 MCG VA INST: 1.0000 | VAGINAL_INSERT | VAGINAL | 4 refills | Status: AC

## 2024-03-26 MED ORDER — PROGESTERONE MICRONIZED 100 MG PO CAPS
100.0000 mg | ORAL_CAPSULE | Freq: Every day | ORAL | 4 refills | Status: AC
Start: 2024-03-26 — End: ?

## 2024-03-26 NOTE — Patient Instructions (Signed)
 Preventive Care 16-49 Years Old, Female  Preventive care refers to lifestyle choices and visits with your health care provider that can promote health and wellness. Preventive care visits are also called wellness exams.  What can I expect for my preventive care visit?  Counseling  Your health care provider may ask you questions about your:  Medical history, including:  Past medical problems.  Family medical history.  Pregnancy history.  Current health, including:  Menstrual cycle.  Method of birth control.  Emotional well-being.  Home life and relationship well-being.  Sexual activity and sexual health.  Lifestyle, including:  Alcohol, nicotine or tobacco, and drug use.  Access to firearms.  Diet, exercise, and sleep habits.  Work and work Astronomer.  Sunscreen use.  Safety issues such as seatbelt and bike helmet use.  Physical exam  Your health care provider will check your:  Height and weight. These may be used to calculate your BMI (body mass index). BMI is a measurement that tells if you are at a healthy weight.  Waist circumference. This measures the distance around your waistline. This measurement also tells if you are at a healthy weight and may help predict your risk of certain diseases, such as type 2 diabetes and high blood pressure.  Heart rate and blood pressure.  Body temperature.  Skin for abnormal spots.  What immunizations do I need?    Vaccines are usually given at various ages, according to a schedule. Your health care provider will recommend vaccines for you based on your age, medical history, and lifestyle or other factors, such as travel or where you work.  What tests do I need?  Screening  Your health care provider may recommend screening tests for certain conditions. This may include:  Lipid and cholesterol levels.  Diabetes screening. This is done by checking your blood sugar (glucose) after you have not eaten for a while (fasting).  Pelvic exam and Pap test.  Hepatitis B test.  Hepatitis C  test.  HIV (human immunodeficiency virus) test.  STI (sexually transmitted infection) testing, if you are at risk.  Lung cancer screening.  Colorectal cancer screening.  Mammogram. Talk with your health care provider about when you should start having regular mammograms. This may depend on whether you have a family history of breast cancer.  BRCA-related cancer screening. This may be done if you have a family history of breast, ovarian, tubal, or peritoneal cancers.  Bone density scan. This is done to screen for osteoporosis.  Talk with your health care provider about your test results, treatment options, and if necessary, the need for more tests.  Follow these instructions at home:  Eating and drinking    Eat a diet that includes fresh fruits and vegetables, whole grains, lean protein, and low-fat dairy products.  Take vitamin and mineral supplements as recommended by your health care provider.  Do not drink alcohol if:  Your health care provider tells you not to drink.  You are pregnant, may be pregnant, or are planning to become pregnant.  If you drink alcohol:  Limit how much you have to 0-1 drink a day.  Know how much alcohol is in your drink. In the [place] one drink equals one 12 oz bottle of beer (355 mL), one 5 oz glass of wine (148 mL), or one 1 oz glass of hard liquor (44 mL).  Lifestyle  Brush your teeth every morning and night with fluoride toothpaste. Floss one time each day.  Exercise for at least  30 minutes 5 or more days each week.  Do not use any products that contain nicotine or tobacco. These products include cigarettes, chewing tobacco, and vaping devices, such as e-cigarettes. If you need help quitting, ask your health care provider.  Do not use drugs.  If you are sexually active, practice safe sex. Use a condom or other form of protection to prevent STIs.  If you do not wish to become pregnant, use a form of birth control. If you plan to become pregnant, see your health care provider for a  prepregnancy visit.  Take aspirin only as told by your health care provider. Make sure that you understand how much to take and what form to take. Work with your health care provider to find out whether it is safe and beneficial for you to take aspirin daily.  Find healthy ways to manage stress, such as:  Meditation, yoga, or listening to music.  Journaling.  Talking to a trusted person.  Spending time with friends and family.  Minimize exposure to UV radiation to reduce your risk of skin cancer.  Safety  Always wear your seat belt while driving or riding in a vehicle.  Do not drive:  If you have been drinking alcohol. Do not ride with someone who has been drinking.  When you are tired or distracted.  While texting.  If you have been using any mind-altering substances or drugs.  Wear a helmet and other protective equipment during sports activities.  If you have firearms in your house, make sure you follow all gun safety procedures.  Seek help if you have been physically or sexually abused.  What's next?  Visit your health care provider once a year for an annual wellness visit.  Ask your health care provider how often you should have your eyes and teeth checked.  Stay up to date on all vaccines.  This information is not intended to replace advice given to you by your health care provider. Make sure you discuss any questions you have with your health care provider.  Document Revised: 03/22/2021 Document Reviewed: 03/22/2021  Elsevier Patient Education  2024 [employer].

## 2024-03-26 NOTE — Progress Notes (Signed)
 Amy Bautista  August 15, 1976 [id number]   History:  48 y.o. G2p2 presents for annual exam. Doing well. Needs refills on HRT, took a break from the patch and progesterone  but would like to try again.  Gynecologic History No LMP recorded. (Menstrual status: Irregular Periods).   Sexually active: yes Last Pap: 2023. Results were: normal Last mammogram: 2/23. Results were: normal  Obstetric History OB History  Gravida Para Term Preterm AB Living  2 2    2   SAB IAB Ectopic Multiple Live Births          # Outcome Date GA Lbr Len/2nd Weight Sex Type Anes PTL Lv  2 Para           1 Para                07/12/2020   10:23 AM  Depression screen PHQ 2/9  Decreased Interest 0  Down, Depressed, Hopeless 0  PHQ - 2 Score 0     The following portions of the patient's history were reviewed and updated as appropriate: allergies, current medications, past family history, past medical history, past social history, past surgical history, and problem list.  Review of Systems  All other systems reviewed and are negative.   Past medical history, past surgical history, family history and social history were all reviewed and documented in the EPIC chart.  Exam:  There were no vitals filed for this visit. There is no height or weight on file to calculate BMI.  Physical Exam Vitals and nursing note reviewed. Exam conducted with a chaperone present.  Constitutional:      Appearance: Normal appearance. She is normal weight.  HENT:     Head: Normocephalic and atraumatic.  Neck:     Thyroid : No thyroid  mass, thyromegaly or thyroid  tenderness.   Cardiovascular:     Rate and Rhythm: Regular rhythm.     Heart sounds: Normal heart sounds.  Pulmonary:     Effort: Pulmonary effort is normal.     Breath sounds: Normal breath sounds.  Chest:  Breasts:    Breasts are symmetrical.     Right: Normal. No inverted nipple, mass, nipple discharge, skin change or tenderness.     Left: Normal.  No inverted nipple, mass, nipple discharge, skin change or tenderness.  Abdominal:     General: Abdomen is flat. Bowel sounds are normal.     Palpations: Abdomen is soft.  Genitourinary:    General: Normal vulva.     Vagina: Normal. No vaginal discharge, bleeding or lesions.     Cervix: Normal. No discharge or lesion.     Uterus: Normal. Not enlarged and not tender.      Adnexa: Right adnexa normal and left adnexa normal.       Right: No mass, tenderness or fullness.         Left: No mass, tenderness or fullness.    Lymphadenopathy:     Upper Body:     Right upper body: No axillary adenopathy.     Left upper body: No axillary adenopathy.   Skin:    General: Skin is warm and dry.   Neurological:     Mental Status: She is alert and oriented to person, place, and time.   Psychiatric:        Mood and Affect: Mood normal.        Thought Content: Thought content normal.        Judgment: Judgment normal.      Amy Bautista  Wynona Hedger, CMA present for exam  Assessment/Plan:   1. Well woman exam with routine gynecological exam (Primary) Pap 2026 Schedule overdue mammogram  2. Perimenopausal symptoms - estradiol  (VIVELLE -DOT) 0.025 MG/24HR; Place 1 patch onto the skin 2 (two) times a week.  Dispense: 24 patch; Refill: 4 - progesterone  (PROMETRIUM ) 100 MG capsule; Take 1 capsule (100 mg total) by mouth daily.  Dispense: 90 capsule; Refill: 4  3. Atrophic vaginitis - Estradiol  (IMVEXXY  MAINTENANCE PACK) 10 MCG INST; Place 1 tablet vaginally 2 (two) times a week.  Dispense: 24 each; Refill: 4  4. Screen for colon cancer - Cologuard   5. Depression screening negative Doing well on lexapro    Return in about 1 year (around 03/26/2025) for Annual.  Synetta Eves B WHNP-BC 9:09 AM 03/26/2024

## 2024-04-09 DIAGNOSIS — E890 Postprocedural hypothyroidism: Secondary | ICD-10-CM | POA: Diagnosis not present

## 2024-04-13 DIAGNOSIS — E890 Postprocedural hypothyroidism: Secondary | ICD-10-CM | POA: Diagnosis not present

## 2024-04-13 DIAGNOSIS — C73 Malignant neoplasm of thyroid gland: Secondary | ICD-10-CM | POA: Diagnosis not present

## 2024-06-05 DIAGNOSIS — N951 Menopausal and female climacteric states: Secondary | ICD-10-CM | POA: Diagnosis not present

## 2024-06-05 DIAGNOSIS — E039 Hypothyroidism, unspecified: Secondary | ICD-10-CM | POA: Diagnosis not present

## 2024-06-05 DIAGNOSIS — F909 Attention-deficit hyperactivity disorder, unspecified type: Secondary | ICD-10-CM | POA: Diagnosis not present

## 2024-06-05 DIAGNOSIS — F411 Generalized anxiety disorder: Secondary | ICD-10-CM | POA: Diagnosis not present

## 2024-06-09 DIAGNOSIS — F4310 Post-traumatic stress disorder, unspecified: Secondary | ICD-10-CM | POA: Diagnosis not present

## 2024-06-23 DIAGNOSIS — F4310 Post-traumatic stress disorder, unspecified: Secondary | ICD-10-CM | POA: Diagnosis not present

## 2024-07-06 DIAGNOSIS — F4310 Post-traumatic stress disorder, unspecified: Secondary | ICD-10-CM | POA: Diagnosis not present

## 2024-07-15 DIAGNOSIS — E039 Hypothyroidism, unspecified: Secondary | ICD-10-CM | POA: Diagnosis not present

## 2024-07-15 DIAGNOSIS — F909 Attention-deficit hyperactivity disorder, unspecified type: Secondary | ICD-10-CM | POA: Diagnosis not present

## 2024-07-15 DIAGNOSIS — J4540 Moderate persistent asthma, uncomplicated: Secondary | ICD-10-CM | POA: Diagnosis not present

## 2024-07-15 DIAGNOSIS — Z85850 Personal history of malignant neoplasm of thyroid: Secondary | ICD-10-CM | POA: Diagnosis not present

## 2024-07-15 DIAGNOSIS — F988 Other specified behavioral and emotional disorders with onset usually occurring in childhood and adolescence: Secondary | ICD-10-CM | POA: Diagnosis not present

## 2024-07-15 DIAGNOSIS — N951 Menopausal and female climacteric states: Secondary | ICD-10-CM | POA: Diagnosis not present

## 2024-07-15 DIAGNOSIS — Z79899 Other long term (current) drug therapy: Secondary | ICD-10-CM | POA: Diagnosis not present

## 2024-07-15 DIAGNOSIS — F411 Generalized anxiety disorder: Secondary | ICD-10-CM | POA: Diagnosis not present

## 2024-08-17 DIAGNOSIS — D225 Melanocytic nevi of trunk: Secondary | ICD-10-CM | POA: Diagnosis not present

## 2024-08-17 DIAGNOSIS — L821 Other seborrheic keratosis: Secondary | ICD-10-CM | POA: Diagnosis not present

## 2024-08-17 DIAGNOSIS — Z85820 Personal history of malignant melanoma of skin: Secondary | ICD-10-CM | POA: Diagnosis not present

## 2024-08-17 DIAGNOSIS — D2261 Melanocytic nevi of right upper limb, including shoulder: Secondary | ICD-10-CM | POA: Diagnosis not present

## 2024-08-17 DIAGNOSIS — L738 Other specified follicular disorders: Secondary | ICD-10-CM | POA: Diagnosis not present

## 2024-08-17 DIAGNOSIS — D2272 Melanocytic nevi of left lower limb, including hip: Secondary | ICD-10-CM | POA: Diagnosis not present

## 2024-08-17 DIAGNOSIS — D2221 Melanocytic nevi of right ear and external auricular canal: Secondary | ICD-10-CM | POA: Diagnosis not present

## 2024-08-17 DIAGNOSIS — D2262 Melanocytic nevi of left upper limb, including shoulder: Secondary | ICD-10-CM | POA: Diagnosis not present

## 2024-08-17 DIAGNOSIS — D2271 Melanocytic nevi of right lower limb, including hip: Secondary | ICD-10-CM | POA: Diagnosis not present

## 2024-08-31 DIAGNOSIS — Z0000 Encounter for general adult medical examination without abnormal findings: Secondary | ICD-10-CM | POA: Diagnosis not present

## 2024-08-31 DIAGNOSIS — Z13220 Encounter for screening for lipoid disorders: Secondary | ICD-10-CM | POA: Diagnosis not present

## 2024-09-02 DIAGNOSIS — J45909 Unspecified asthma, uncomplicated: Secondary | ICD-10-CM | POA: Diagnosis not present

## 2024-09-02 DIAGNOSIS — F331 Major depressive disorder, recurrent, moderate: Secondary | ICD-10-CM | POA: Diagnosis not present

## 2024-09-02 DIAGNOSIS — Z85850 Personal history of malignant neoplasm of thyroid: Secondary | ICD-10-CM | POA: Diagnosis not present

## 2024-09-02 DIAGNOSIS — F411 Generalized anxiety disorder: Secondary | ICD-10-CM | POA: Diagnosis not present

## 2024-09-02 DIAGNOSIS — F988 Other specified behavioral and emotional disorders with onset usually occurring in childhood and adolescence: Secondary | ICD-10-CM | POA: Diagnosis not present

## 2024-09-02 DIAGNOSIS — T7840XA Allergy, unspecified, initial encounter: Secondary | ICD-10-CM | POA: Diagnosis not present

## 2024-09-02 DIAGNOSIS — G4700 Insomnia, unspecified: Secondary | ICD-10-CM | POA: Diagnosis not present

## 2024-09-02 DIAGNOSIS — F909 Attention-deficit hyperactivity disorder, unspecified type: Secondary | ICD-10-CM | POA: Diagnosis not present

## 2024-09-02 DIAGNOSIS — Z79899 Other long term (current) drug therapy: Secondary | ICD-10-CM | POA: Diagnosis not present

## 2024-09-02 DIAGNOSIS — R7989 Other specified abnormal findings of blood chemistry: Secondary | ICD-10-CM | POA: Diagnosis not present

## 2024-09-02 DIAGNOSIS — E039 Hypothyroidism, unspecified: Secondary | ICD-10-CM | POA: Diagnosis not present

## 2024-09-09 DIAGNOSIS — Z0000 Encounter for general adult medical examination without abnormal findings: Secondary | ICD-10-CM | POA: Diagnosis not present

## 2024-09-09 DIAGNOSIS — R030 Elevated blood-pressure reading, without diagnosis of hypertension: Secondary | ICD-10-CM | POA: Diagnosis not present
